# Patient Record
Sex: Male | Born: 1990 | Race: White | Hispanic: No | Marital: Single | Smoking: Former smoker
Health system: Southern US, Community
[De-identification: ages and names within clinical notes are randomized; demographics above are authoritative.]

## PROBLEM LIST (undated history)

## (undated) DIAGNOSIS — F329 Major depressive disorder, single episode, unspecified: Secondary | ICD-10-CM

## (undated) DIAGNOSIS — H9190 Unspecified hearing loss, unspecified ear: Secondary | ICD-10-CM

## (undated) DIAGNOSIS — F32A Depression, unspecified: Secondary | ICD-10-CM

## (undated) DIAGNOSIS — S8991XA Unspecified injury of right lower leg, initial encounter: Secondary | ICD-10-CM

## (undated) DIAGNOSIS — F419 Anxiety disorder, unspecified: Secondary | ICD-10-CM

## (undated) DIAGNOSIS — H8103 Meniere's disease, bilateral: Secondary | ICD-10-CM

## (undated) DIAGNOSIS — F410 Panic disorder [episodic paroxysmal anxiety] without agoraphobia: Secondary | ICD-10-CM

## (undated) HISTORY — DX: Depression, unspecified: F32.A

## (undated) HISTORY — DX: Major depressive disorder, single episode, unspecified: F32.9

## (undated) HISTORY — DX: Panic disorder (episodic paroxysmal anxiety): F41.0

## (undated) HISTORY — DX: Meniere's disease, bilateral: H81.03

## (undated) HISTORY — DX: Anxiety disorder, unspecified: F41.9

## (undated) HISTORY — DX: Unspecified hearing loss, unspecified ear: H91.90

## (undated) HISTORY — DX: Unspecified injury of right lower leg, initial encounter: S89.91XA

---
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
## 2012-01-16 HISTORY — PX: KNEE ARTHROSCOPY W/ DEBRIDEMENT: SHX1867

## 2015-08-28 ENCOUNTER — Telehealth: Payer: Self-pay

## 2015-08-28 NOTE — Telephone Encounter (Signed)
Left message for patient to return my call regarding pr-visit information. 

## 2015-08-29 ENCOUNTER — Ambulatory Visit (INDEPENDENT_AMBULATORY_CARE_PROVIDER_SITE_OTHER): Payer: BLUE CROSS/BLUE SHIELD | Admitting: Internal Medicine

## 2015-08-29 ENCOUNTER — Encounter: Payer: Self-pay | Admitting: Internal Medicine

## 2015-08-29 VITALS — BP 140/86 | HR 82 | Temp 98.3°F | Ht 73.0 in | Wt 177.0 lb

## 2015-08-29 DIAGNOSIS — F418 Other specified anxiety disorders: Secondary | ICD-10-CM

## 2015-08-29 DIAGNOSIS — F411 Generalized anxiety disorder: Secondary | ICD-10-CM | POA: Diagnosis not present

## 2015-08-29 DIAGNOSIS — F988 Other specified behavioral and emotional disorders with onset usually occurring in childhood and adolescence: Secondary | ICD-10-CM

## 2015-08-29 DIAGNOSIS — Z09 Encounter for follow-up examination after completed treatment for conditions other than malignant neoplasm: Secondary | ICD-10-CM

## 2015-08-29 DIAGNOSIS — F329 Major depressive disorder, single episode, unspecified: Secondary | ICD-10-CM

## 2015-08-29 DIAGNOSIS — F909 Attention-deficit hyperactivity disorder, unspecified type: Secondary | ICD-10-CM | POA: Diagnosis not present

## 2015-08-29 DIAGNOSIS — F419 Anxiety disorder, unspecified: Principal | ICD-10-CM

## 2015-08-29 MED ORDER — ESCITALOPRAM OXALATE 5 MG PO TABS: 5.0000 mg | ORAL_TABLET | Freq: Every day | ORAL | Status: DC

## 2015-08-29 NOTE — Patient Instructions (Signed)
Before you leave the office:  GO TO THE LAB  and get the blood work    GO TO THE FRONT DESK  Schedule a routine office visit or check up to be done in  4-5 weeks No  fasting     After you leave the office:  Get an appointment to see a psychiatrist  Watch for symptoms of mania    Bipolar Disorder Bipolar disorder is a mental illness. The term bipolar disorder actually is used to describe a group of disorders that all share varying degrees of emotional highs and lows that can interfere with daily functioning, such as work, school, or relationships. Bipolar disorder also can lead to drug abuse, hospitalization, and suicide. The emotional highs of bipolar disorder are periods of elation or irritability and high energy. These highs can range from a mild form (hypomania) to a severe form (mania). People experiencing episodes of hypomania may appear energetic, excitable, and highly productive. People experiencing mania may behave impulsively or erratically. They often make poor decisions. They may have difficulty sleeping. The most severe episodes of mania can involve having very distorted beliefs or perceptions about the world and seeing or hearing things that are not real (psychotic delusions and hallucinations).  The emotional lows of bipolar disorder (depression) also can range from mild to severe. Severe episodes of bipolar depression can involve psychotic delusions and hallucinations. Sometimes people with bipolar disorder experience a state of mixed mood. Symptoms of hypomania or mania and depression are both present during this mixed-mood episode. SIGNS AND SYMPTOMS There are signs and symptoms of the episodes of hypomania and mania as well as the episodes of depression. The signs and symptoms of hypomania and mania are similar but vary in severity. They include:  Inflated self-esteem or feeling of increased self-confidence.  Decreased need for sleep.  Unusual talkativeness (rapid or  pressured speech) or the feeling of a need to keep talking.  Sensation of racing thoughts or constant talking, with quick shifts between topics that may or may not be related (flight of ideas).  Decreased ability to focus or concentrate.  Increased purposeful activity, such as work, studies, or social activity, or nonproductive activity, such as pacing, squirming and fidgeting, or finger and toe tapping.  Impulsive behavior and use of poor judgment, resulting in high-risk activities, such as having unprotected sex or spending excessive amounts of money. Signs and symptoms of depression include the following:   Feelings of sadness, hopelessness, or helplessness.  Frequent or uncontrollable episodes of crying.  Lack of feeling anything or caring about anything.  Difficulty sleeping or sleeping too much.  Inability to enjoy the things you used to enjoy.   Desire to be alone all the time.   Feelings of guilt or worthlessness.  Lack of energy or motivation.   Difficulty concentrating, remembering, or making decisions.  Change in appetite or weight beyond normal fluctuations.  Thoughts of death or the desire to harm yourself. DIAGNOSIS  Bipolar disorder is diagnosed through an assessment by your caregiver. Your caregiver will ask questions about your emotional episodes. There are two main types of bipolar disorder. People with type I bipolar disorder have manic episodes with or without depressive episodes. People with type II bipolar disorder have hypomanic episodes and major depressive episodes, which are more serious than mild depression. The type of bipolar disorder you have can make an important difference in how your illness is monitored and treated. Your caregiver may ask questions about your medical history and  use of alcohol or drugs, including prescription medication. Certain medical conditions and substances also can cause emotional highs and lows that resemble bipolar  disorder (secondary bipolar disorder).  TREATMENT  Bipolar disorder is a long-term illness. It is best controlled with continuous treatment rather than treatment only when symptoms occur. The following treatments can be prescribed for bipolar disorders:  Medication--Medication can be prescribed by a doctor that is an expert in treating mental disorders (psychiatrists). Medications called mood stabilizers are usually prescribed to help control the illness. Other medications are sometimes added if symptoms of mania, depression, or psychotic delusions and hallucinations occur despite the use of a mood stabilizer.  Talk therapy--Some forms of talk therapy are helpful in providing support, education, and guidance. A combination of medication and talk therapy is best for managing the disorder over time. A procedure in which electricity is applied to your brain through your scalp (electroconvulsive therapy) is used in cases of severe mania when medication and talk therapy do not work or work too slowly.   This information is not intended to replace advice given to you by your health care provider. Make sure you discuss any questions you have with your health care provider.   Document Released: 12/01/2000 Document Revised: 09/15/2014 Document Reviewed: 09/20/2012 Elsevier Interactive Patient Education [employer]2016 Elsevier Inc.

## 2015-08-29 NOTE — Progress Notes (Signed)
Subjective:    Patient ID: Devin Chang, male    DOB: [birth year], 24 y.o.   MRN: [id number]  DOS:  08/29/2015 Type of visit - description : New patient, here with his mother. Interval history: His main concern is anxiety and depression. He moved he has anxiety and depression for many years. He realizes he also had ADD but H 21. 3 years ago in 2013 went to a psychologist, he was diagnosed with ADD but never treated because they recommend him to treat anxiety and depression first. Since then he is seeing a counselor, intolerance have not really changed much. In fact, he had to leave college for a few months earlier this year. Approximately on August 2016 he was feeling so badly physically when he got and shoes that he is started to smoke marijuana daily, that helped to some extent. He has multiple symptoms including anxiety, panic attacks, palpitations, poor sleep, sweats, lack of motivation, recommend thoughts. On further questioning, he does have episodes when he feels very depressed and others when he feels very good.     Review of Systems No fever chills, no weight loss No chest pain, shortness of breath associated with extreme anxiety. No syncope. At some point in the past he thought suicidal thoughts, not at the present time  Past Medical History  Diagnosis Date  . Right knee injury 2012    small chondral injury, right arthroscopy in 01/2012  . Depression   . Anxiety   . Panic attacks   . Hearing loss     left ear    Past Surgical History  Procedure Laterality Date  . Knee arthroscopy w/ debridement Right 01/16/2012    w/ Plica release, at [facility] in [place], [place]  . Wisdom tooth extraction  2012    Dr. Retta MacMohorn [place]    Social History   Social History  . Marital Status: Single    Spouse Name: N/A  . Number of Children: N/A  . Years of Education: 17   Occupational History  . student [employer].     Social History Main Topics  . Smoking status: Former  Games developermoker  . Smokeless tobacco: Not on file  . Alcohol Use: 0.0 oz/week    0 Standard drinks or equivalent per week  . Drug Use: Yes     Comment: Marijuana  . Sexual Activity: Not on file   Other Topics Concern  . Not on file   Social History Narrative        Medication List       This list is accurate as of: 08/29/15  3:49 PM.  Always use your most recent med list.               clindamycin 1 % gel  Commonly known as:  CLINDAGEL  Apply topically daily.     Melatonin 10 MG Caps  Take by mouth at bedtime as needed.           Objective:   Physical Exam BP 140/86 mmHg  Pulse 82  Temp(Src) 98.3 F (36.8 C) (Oral)  Ht 6\' 1"  (1.854 m)  Wt 177 lb (80.287 kg)  BMI 23.36 kg/m2  SpO2 99% General:   Well developed, well nourished . NAD.  HEENT:  Normocephalic . Face symmetric, atraumatic. Neck: No thyromegaly Lungs:  CTA B Normal respiratory effort, no intercostal retractions, no accessory muscle use. Heart: RRR,  no murmur.  No pretibial edema bilaterally  Skin: Not pale. Not jaundice Neurologic:  alert & oriented X3.  Speech normal, gait appropriate for age and unassisted Psych--  Cognition and judgment appear intact.  Cooperative with normal attention span and concentration.  Behavior appropriate. Moderately anxious, not depressed appearing. 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 PQQ9 scored  #24 which is severe depression. No suicidal. I do suspect bipolar on clinical grounds, and I recommend to see a psychiatrist however that may take a couple of months if not more. In the meantime I don't like to help him. We talk about possibly doing a low dose of SSRIs understanding the risk of induce mania sx  including risky behavior etc.  After the understood the risks, they agree to proceed, will prescribe Lexapro 5 mg daily, encourage close monitoring by family which they agreed to do. Recommend to see psychiatry as soon as possible Labs Continue counseling Stop marijuana RTC 4-5 weeks please he is seen by psychiatry.   Today, I spent more than 32   min with the patient: >50% of the time counseling regards anxiety, depression, available therapies, risks taking SSRIs.

## 2015-08-29 NOTE — Progress Notes (Signed)
Pre visit review using our clinic review tool, if applicable. No additional management support is needed unless otherwise documented below in the visit note. 

## 2015-08-30 LAB — BASIC METABOLIC PANEL
BUN: 16 mg/dL (ref 6–23)
CHLORIDE: 103 meq/L (ref 96–112)
CO2: 27 meq/L (ref 19–32)
Calcium: 10.3 mg/dL (ref 8.4–10.5)
Creatinine, Ser: 0.85 mg/dL (ref 0.40–1.50)
GFR: 118.35 mL/min (ref >60.00)
GLUCOSE: 90 mg/dL (ref 70–99)
POTASSIUM: 4.3 meq/L (ref 3.5–5.1)
SODIUM: 140 meq/L (ref 135–145)

## 2015-08-30 LAB — CBC WITH DIFFERENTIAL/PLATELET
BASOS PCT: 1.1 % (ref 0.0–3.0)
Basophils Absolute: 0.1 10*3/uL (ref 0.0–0.1)
EOS PCT: 0.8 % (ref 0.0–5.0)
Eosinophils Absolute: 0.1 10*3/uL (ref 0.0–0.7)
HCT: 46.8 % (ref 39.0–52.0)
Hemoglobin: 15.7 g/dL (ref 13.0–17.0)
LYMPHS ABS: 1.9 10*3/uL (ref 0.7–4.0)
Lymphocytes Relative: 21 % (ref 12.0–46.0)
MCHC: 33.5 g/dL (ref 30.0–36.0)
MCV: 90.2 fl (ref 78.0–100.0)
MONO ABS: 0.7 10*3/uL (ref 0.1–1.0)
Monocytes Relative: 7.8 % (ref 3.0–12.0)
NEUTROS ABS: 6.4 10*3/uL (ref 1.4–7.7)
NEUTROS PCT: 69.3 % (ref 43.0–77.0)
PLATELETS: 310 10*3/uL (ref 150.0–400.0)
RBC: 5.19 Mil/uL (ref 4.22–5.81)
RDW: 13.3 % (ref 11.5–15.5)
WBC: 9.2 10*3/uL (ref 4.0–10.5)

## 2015-08-30 LAB — TSH: TSH: 1.25 u[IU]/mL (ref 0.35–4.50)

## 2015-08-31 ENCOUNTER — Encounter: Payer: Self-pay | Admitting: Internal Medicine

## 2015-08-31 DIAGNOSIS — F329 Major depressive disorder, single episode, unspecified: Secondary | ICD-10-CM | POA: Insufficient documentation

## 2015-08-31 DIAGNOSIS — Z09 Encounter for follow-up examination after completed treatment for conditions other than malignant neoplasm: Secondary | ICD-10-CM | POA: Insufficient documentation

## 2015-08-31 DIAGNOSIS — F988 Other specified behavioral and emotional disorders with onset usually occurring in childhood and adolescence: Secondary | ICD-10-CM | POA: Insufficient documentation

## 2015-08-31 DIAGNOSIS — F419 Anxiety disorder, unspecified: Principal | ICD-10-CM | POA: Insufficient documentation

## 2015-08-31 NOTE — Assessment & Plan Note (Signed)
Anxiety, depression, ADD: Long history of symptoms, they have been untreated except for counseling for the last 3 years. PQQ9 scored  #24 which is severe depression. No suicidal. I do suspect bipolar on clinical grounds, and I recommend to see a psychiatrist however that may take a couple of months if not more. In the meantime I don't like to help him. We talk about possibly doing a low dose of SSRIs understanding the risk of induce mania sx including risky behavior etc.  After the understood the risks, they agree to proceed, will prescribe Lexapro 5 mg daily, encourage close monitoring by family which they agreed to do. Recommend to see psychiatry as soon as possible Labs Continue counseling Stop marijuana RTC 4-5 weeks please he is seen by psychiatry.

## 2015-09-24 ENCOUNTER — Ambulatory Visit: Payer: BLUE CROSS/BLUE SHIELD | Admitting: Internal Medicine

## 2015-12-11 DIAGNOSIS — F411 Generalized anxiety disorder: Secondary | ICD-10-CM | POA: Diagnosis not present

## 2016-01-01 DIAGNOSIS — F411 Generalized anxiety disorder: Secondary | ICD-10-CM | POA: Diagnosis not present

## 2016-01-08 DIAGNOSIS — F411 Generalized anxiety disorder: Secondary | ICD-10-CM | POA: Diagnosis not present

## 2016-01-22 DIAGNOSIS — F411 Generalized anxiety disorder: Secondary | ICD-10-CM | POA: Diagnosis not present

## 2016-02-26 DIAGNOSIS — F411 Generalized anxiety disorder: Secondary | ICD-10-CM | POA: Diagnosis not present

## 2016-03-04 DIAGNOSIS — F411 Generalized anxiety disorder: Secondary | ICD-10-CM | POA: Diagnosis not present

## 2016-04-11 DIAGNOSIS — F411 Generalized anxiety disorder: Secondary | ICD-10-CM | POA: Diagnosis not present

## 2016-05-16 DIAGNOSIS — F411 Generalized anxiety disorder: Secondary | ICD-10-CM | POA: Diagnosis not present

## 2016-06-20 DIAGNOSIS — F411 Generalized anxiety disorder: Secondary | ICD-10-CM | POA: Diagnosis not present

## 2016-06-24 DIAGNOSIS — F411 Generalized anxiety disorder: Secondary | ICD-10-CM | POA: Diagnosis not present

## 2016-07-08 DIAGNOSIS — F411 Generalized anxiety disorder: Secondary | ICD-10-CM | POA: Diagnosis not present

## 2016-07-25 DIAGNOSIS — F411 Generalized anxiety disorder: Secondary | ICD-10-CM | POA: Diagnosis not present

## 2016-08-15 DIAGNOSIS — F411 Generalized anxiety disorder: Secondary | ICD-10-CM | POA: Diagnosis not present

## 2016-09-08 DIAGNOSIS — H9190 Unspecified hearing loss, unspecified ear: Secondary | ICD-10-CM

## 2016-09-08 HISTORY — DX: Unspecified hearing loss, unspecified ear: H91.90

## 2016-09-08 HISTORY — PX: TONSILLECTOMY: SUR1361

## 2016-09-12 DIAGNOSIS — F411 Generalized anxiety disorder: Secondary | ICD-10-CM | POA: Diagnosis not present

## 2016-09-18 ENCOUNTER — Ambulatory Visit (INDEPENDENT_AMBULATORY_CARE_PROVIDER_SITE_OTHER): Payer: BLUE CROSS/BLUE SHIELD | Admitting: Physician Assistant

## 2016-09-18 VITALS — BP 122/84 | HR 64 | Temp 98.5°F | Resp 16 | Ht 72.0 in | Wt 169.8 lb

## 2016-09-18 DIAGNOSIS — J029 Acute pharyngitis, unspecified: Secondary | ICD-10-CM | POA: Diagnosis not present

## 2016-09-18 LAB — POCT RAPID STREP A (OFFICE): Rapid Strep A Screen: NEGATIVE

## 2016-09-18 NOTE — Patient Instructions (Addendum)
Your rapid strep test was negative. I will contact you in a few days if your culture is positive. If it is positive, I will call in a prescription for antibiotics to your pharmacy. At this time the treatment is supportive care. Please see below for home care tips. You can also gargle with liquid Benadryl if your pain increases. Stay well hydrated. Tylenol for pain. You may develop a fever over the next few days. This is typical with viruses.  Come back in 5-7 days if you are not better/symptoms worsen.   Pharyngitis Pharyngitis is redness, pain, and swelling (inflammation) of your pharynx. What are the causes? Pharyngitis is usually caused by infection. Most of the time, these infections are from viruses (viral) and are part of a cold. However, sometimes pharyngitis is caused by bacteria (bacterial). Pharyngitis can also be caused by allergies. Viral pharyngitis may be spread from person to person by coughing, sneezing, and personal items or utensils (cups, forks, spoons, toothbrushes). Bacterial pharyngitis may be spread from person to person by more intimate contact, such as kissing. What are the signs or symptoms? Symptoms of pharyngitis include:  Sore throat.  Tiredness (fatigue).  Low-grade fever.  Headache.  Joint pain and muscle aches.  Skin rashes.  Swollen lymph nodes.  Plaque-like film on throat or tonsils (often seen with bacterial pharyngitis). How is this diagnosed? Your health care provider will ask you questions about your illness and your symptoms. Your medical history, along with a physical exam, is often all that is needed to diagnose pharyngitis. Sometimes, a rapid strep test is done. Other lab tests may also be done, depending on the suspected cause. How is this treated? Viral pharyngitis will usually get better in 3-4 days without the use of medicine. Bacterial pharyngitis is treated with medicines that kill germs (antibiotics). Follow these instructions at  home:  Drink enough water and fluids to keep your urine clear or pale yellow.  Only take over-the-counter or prescription medicines as directed by your health care provider:  If you are prescribed antibiotics, make sure you finish them even if you start to feel better.  Do not take aspirin.  Get lots of rest.  Gargle with 8 oz of salt water ( tsp of salt per 1 qt of water) as often as every 1-2 hours to soothe your throat.  Throat lozenges (if you are not at risk for choking) or sprays may be used to soothe your throat. Contact a health care provider if:  You have large, tender lumps in your neck.  You have a rash.  You cough up green, yellow-brown, or bloody spit. Get help right away if:  Your neck becomes stiff.  You drool or are unable to swallow liquids.  You vomit or are unable to keep medicines or liquids down.  You have severe pain that does not go away with the use of recommended medicines.  You have trouble breathing (not caused by a stuffy nose). This information is not intended to replace advice given to you by your health care provider. Make sure you discuss any questions you have with your health care provider. Document Released: 08/25/2005 Document Revised: 01/31/2016 Document Reviewed: 05/02/2013 Elsevier Interactive Patient Education  2017 [employer]    IF you received an x-ray today, you will receive an invoice from [facility]. Please contact [facility] at [phone] with questions or concerns regarding your invoice.   IF you received labwork today, you will receive an invoice from [facility]. Please contact  [facility] at [phone] with questions or concerns regarding your invoice.   Our billing staff will not be able to assist you with questions regarding bills from these companies.  You will be contacted with the lab results as soon as they are available. The fastest way to get your results is to activate your My Chart account.  Instructions are located on the last page of this paperwork. If you have not heard from [place] regarding the results in 2 weeks, please contact this office.

## 2016-09-18 NOTE — Progress Notes (Signed)
   Al CorpusStanhope M [place]any  MRN: [id number] DOB: 02-05-1991  PCP: Willow OraJose Paz, MD  Subjective:  Pt is a 26 year old male who presents to clinic for sore throat x two days. He felt a tickle in his throat yesterday. This morning he woke up with 4/10 throat pain. +difficulty and pain with swallowing. +body aches and chills yesterday  Took Tylenol yesterday, didn't help much.  Denies SOB, difficulty breathing, fever, headache, chest pain, cough.  Possible sick contact: His boss has had strep and flu in the last 4 weeks.   Review of Systems  Constitutional: Negative for chills and diaphoresis.  HENT: Positive for sore throat.   Respiratory: Negative for cough, chest tightness, shortness of breath and wheezing.   Cardiovascular: Negative for chest pain, palpitations and leg swelling.  Gastrointestinal: Negative for diarrhea, nausea and vomiting.  Neurological: Negative for dizziness, syncope, light-headedness and headaches.  Psychiatric/Behavioral: Negative for sleep disturbance.    Patient Active Problem List   Diagnosis Date Noted  . Anxiety and depression 08/31/2015  . ADD (attention deficit disorder) 08/31/2015  . PCP NOTES >>>>>>> 08/31/2015    Current Outpatient Prescriptions on File Prior to Visit  Medication Sig Dispense Refill  . escitalopram (LEXAPRO) 5 MG tablet Take 1 tablet (5 mg total) by mouth daily. 30 tablet 1  . clindamycin (CLINDAGEL) 1 % gel Apply topically daily.    . Melatonin 10 MG CAPS Take by mouth at bedtime as needed.     No current facility-administered medications on file prior to visit.     No Known Allergies   Objective:  BP 122/84   Pulse 64   Temp 98.5 F (36.9 C) (Oral)   Resp 16   Ht 6' (1.829 m)   Wt 169 lb 12.8 oz (77 kg)   SpO2 96%   BMI 23.03 kg/m   Physical Exam  Constitutional: He is oriented to person, place, and time and well-developed, well-nourished, and in no distress. No distress.  HENT:  Right Ear: Tympanic membrane normal.    Left Ear: Tympanic membrane normal.  Mouth/Throat: Mucous membranes are normal. Oropharyngeal exudate, posterior oropharyngeal edema and posterior oropharyngeal erythema present.  Cardiovascular: Normal rate, regular rhythm and normal heart sounds.   Neurological: He is alert and oriented to person, place, and time. GCS score is 15.  Skin: Skin is warm and dry.  Psychiatric: Mood, memory, affect and judgment normal.  Vitals reviewed.  Results for orders placed or performed in visit on 09/18/16  POCT rapid strep A  Result Value Ref Range   Rapid Strep A Screen Negative Negative    Assessment and Plan :  1. Acute pharyngitis, unspecified etiology 2. Sore throat - POCT rapid strep A - Culture, Group A Strep - Culture pending. Will contact w results. Supportive care encouraged: Salt water gargles, push fluids. RTC in 5-7 days if no improvement.   Marco CollieWhitney Almas Rake, PA-C  [facility] 09/18/2016 9:06 AM

## 2016-09-21 LAB — CULTURE, GROUP A STREP: Strep A Culture: NEGATIVE

## 2016-09-22 ENCOUNTER — Encounter: Payer: Self-pay | Admitting: Physician Assistant

## 2016-09-22 ENCOUNTER — Ambulatory Visit (INDEPENDENT_AMBULATORY_CARE_PROVIDER_SITE_OTHER): Payer: BLUE CROSS/BLUE SHIELD | Admitting: Physician Assistant

## 2016-09-22 VITALS — BP 112/70 | HR 120 | Temp 98.1°F | Resp 16 | Ht 72.0 in | Wt 167.6 lb

## 2016-09-22 DIAGNOSIS — R070 Pain in throat: Secondary | ICD-10-CM

## 2016-09-22 DIAGNOSIS — R000 Tachycardia, unspecified: Secondary | ICD-10-CM | POA: Diagnosis not present

## 2016-09-22 LAB — POCT CBC
GRANULOCYTE PERCENT: 82.1 % — AB (ref 37–80)
HEMATOCRIT: 44.9 % (ref 43.5–53.7)
Hemoglobin: 15.5 g/dL (ref 14.1–18.1)
LYMPH, POC: 1.5 (ref 0.6–3.4)
MCH, POC: 31.2 pg (ref 27–31.2)
MCHC: 34.6 g/dL (ref 31.8–35.4)
MCV: 90.1 fL (ref 80–97)
MID (CBC): 0.8 (ref 0–0.9)
MPV: 8.7 fL (ref 0–99.8)
POC GRANULOCYTE: 10.7 — AB (ref 2–6.9)
POC LYMPH %: 11.8 % (ref 10–50)
POC MID %: 6.1 %M (ref 0–12)
Platelet Count, POC: 358 10*3/uL (ref 142–424)
RBC: 4.98 M/uL (ref 4.69–6.13)
RDW, POC: 13 %
WBC: 13 10*3/uL — AB (ref 4.6–10.2)

## 2016-09-22 LAB — POCT RAPID STREP A (OFFICE): Rapid Strep A Screen: NEGATIVE

## 2016-09-22 MED ORDER — CEFTRIAXONE SODIUM 1 G IJ SOLR
1.0000 g | Freq: Once | INTRAMUSCULAR | Status: AC
  Administered 2016-09-22: 1 g via INTRAMUSCULAR

## 2016-09-22 MED ORDER — LIDOCAINE VISCOUS 2 % MT SOLN: 20.0000 mL | OROMUCOSAL | 0 refills | Status: DC | PRN

## 2016-09-22 MED ORDER — AMOXICILLIN-POT CLAVULANATE 875-125 MG PO TABS: 1.0000 | ORAL_TABLET | Freq: Two times a day (BID) | ORAL | 0 refills | Status: DC

## 2016-09-22 NOTE — Patient Instructions (Signed)
     IF you received an x-ray today, you will receive an invoice from [facility]. Please contact [facility] at [phone] with questions or concerns regarding your invoice.   IF you received labwork today, you will receive an invoice from [facility]. Please contact [facility] at [phone] with questions or concerns regarding your invoice.   Our billing staff will not be able to assist you with questions regarding bills from these companies.  You will be contacted with the lab results as soon as they are available. The fastest way to get your results is to activate your My Chart account. Instructions are located on the last page of this paperwork. If you have not heard from us regarding the results in 2 weeks, please contact this office.     

## 2016-09-22 NOTE — Progress Notes (Signed)
09/22/2016 11:32 AM   DOB: [birth year] / MRN: [id number]  SUBJECTIVE:  Devin Chang is a 26 y.o. male presenting for left throbbing ear pain that started at 2 am this morning.  Tried tylenol which help. The ear pain has been present for the last 2 days.  Was seen 4 days ago for suspected viral illness. He associates severe throat pain.   He has No Known Allergies.   He  has a past medical history of Anxiety; Depression; Hearing loss; Panic attacks; and Right knee injury (2012).    He  reports that he has quit smoking. He has quit using smokeless tobacco. He reports that he drinks alcohol. He reports that he uses drugs. He  has no sexual activity history on file. The patient  has a past surgical history that includes Knee arthroscopy w/ debridement (Right, 01/16/2012) and Wisdom tooth extraction (2012).  His family history includes Arthritis in his sister; Cancer in his maternal uncle and paternal grandfather; Dementia in his paternal grandmother; Depression in his sister; Hyperlipidemia in his maternal grandmother; Hypertension in his maternal grandmother.  Review of Systems  Constitutional: Positive for malaise/fatigue. Negative for chills, diaphoresis and fever.  HENT: Positive for ear pain (left) and sore throat. Negative for congestion, ear discharge, nosebleeds and sinus pain.   Respiratory: Negative for cough.   Cardiovascular: Negative for chest pain.  Gastrointestinal: Negative for nausea and vomiting.  Neurological: Negative for dizziness.    The problem list and medications were reviewed and updated by myself where necessary and exist elsewhere in the encounter.   OBJECTIVE:  BP 112/70   Pulse (!) 120   Temp 98.1 F (36.7 C) (Oral)   Resp 16   Ht 6' (1.829 m)   Wt 167 lb 9.6 oz (76 kg)   SpO2 99%   BMI 22.73 kg/m   Orthostatic VS for the past 24 hrs:  BP- Lying Pulse- Lying BP- Sitting Pulse- Sitting BP- Standing at 0 minutes Pulse- Standing at 0 minutes  09/22/16  1047 130/70 100 130/80 88 118/80 104       Physical Exam  Constitutional: He is oriented to person, place, and time. He appears well-developed and well-nourished.  HENT:  Right Ear: Tympanic membrane and external ear normal.  Left Ear: Tympanic membrane and external ear normal.  Nose: Nose normal.  Mouth/Throat: Mucous membranes are normal. Oropharyngeal exudate present.    Cardiovascular: Regular rhythm and intact distal pulses.   No extrasystoles are present. Tachycardia present.  Exam reveals no gallop and no friction rub.   No murmur heard. Pulmonary/Chest: Effort normal and breath sounds normal.  Musculoskeletal: Normal range of motion.  Neurological: He is alert and oriented to person, place, and time. No cranial nerve deficit.  Skin: Skin is warm and dry. He is not diaphoretic.    Results for orders placed or performed in visit on 09/22/16 (from the past 72 hour(s))  POCT CBC     Status: Abnormal   Collection Time: 09/22/16 10:47 AM  Result Value Ref Range   WBC 13.0 (A) 4.6 - 10.2 K/uL   Lymph, poc 1.5 0.6 - 3.4   POC LYMPH PERCENT 11.8 10 - 50 %L   MID (cbc) 0.8 0 - 0.9   POC MID % 6.1 0 - 12 %M   POC Granulocyte 10.7 (A) 2 - 6.9   Granulocyte percent 82.1 (A) 37 - 80 %G   RBC 4.98 4.69 - 6.13 M/uL   Hemoglobin 15.5 14.1 - 18.1  g/dL   HCT, POC 16.1 09.6 - 53.7 %   MCV 90.1 80 - 97 fL   MCH, POC 31.2 27 - 31.2 pg   MCHC 34.6 31.8 - 35.4 g/dL   RDW, POC 04.5 %   Platelet Count, POC 358 142 - 424 K/uL   MPV 8.7 0 - 99.8 fL  POCT rapid strep A     Status: None   Collection Time: 09/22/16 10:48 AM  Result Value Ref Range   Rapid Strep A Screen Negative Negative    No results found.  ASSESSMENT AND PLAN:  Devin Chang was seen today for sore throat and ear pain.  Diagnoses and all orders for this visit:  Throat pain in adult Comments: Granyulocytosis and severe left sided throat pain along with swelling.  He has improved with viscous lidocaine here. Afbrile.  Low threshold to RTC tomorrow.  Orders: -     POCT CBC -     POCT rapid strep A -     Culture, Group A Strep -     Orthostatic vital signs -     cefTRIAXone (ROCEPHIN) injection 1 g; Inject 1 g into the muscle once. -     amoxicillin-clavulanate (AUGMENTIN) 875-125 MG tablet; Take 1 tablet by mouth 2 (two) times daily. -     lidocaine (XYLOCAINE) 2 % solution; Use as directed 20 mLs in the mouth or throat every 4 (four) hours as needed for mouth pain.  Sinus tachycardia Comments: Improved on orthostatics.  Possibly 2/2 pain control.     The patient is advised to call or return to clinic if he does not see an improvement in symptoms, or to seek the care of the closest emergency department if he worsens with the above plan.   Deliah Boston, MHS, PA-C [facility] 09/22/2016 11:32 AM

## 2016-09-26 LAB — CULTURE, GROUP A STREP: Strep A Culture: NEGATIVE

## 2016-10-06 DIAGNOSIS — H8103 Meniere's disease, bilateral: Secondary | ICD-10-CM | POA: Diagnosis not present

## 2016-10-06 DIAGNOSIS — H903 Sensorineural hearing loss, bilateral: Secondary | ICD-10-CM | POA: Diagnosis not present

## 2016-10-10 ENCOUNTER — Ambulatory Visit (INDEPENDENT_AMBULATORY_CARE_PROVIDER_SITE_OTHER): Payer: BLUE CROSS/BLUE SHIELD | Admitting: Family Medicine

## 2016-10-10 VITALS — BP 110/76 | HR 90 | Temp 98.2°F | Resp 18 | Ht 72.0 in | Wt 165.0 lb

## 2016-10-10 DIAGNOSIS — Z23 Encounter for immunization: Secondary | ICD-10-CM | POA: Diagnosis not present

## 2016-10-10 DIAGNOSIS — J029 Acute pharyngitis, unspecified: Secondary | ICD-10-CM

## 2016-10-10 DIAGNOSIS — J0120 Acute ethmoidal sinusitis, unspecified: Secondary | ICD-10-CM

## 2016-10-10 LAB — POCT RAPID STREP A (OFFICE): Rapid Strep A Screen: NEGATIVE

## 2016-10-10 MED ORDER — CLARITHROMYCIN ER 500 MG PO TB24: 1000.0000 mg | ORAL_TABLET | Freq: Every day | ORAL | 0 refills | Status: DC

## 2016-10-10 MED ORDER — FLUTICASONE PROPIONATE 50 MCG/ACT NA SUSP: 2.0000 | Freq: Every day | NASAL | 1 refills | Status: DC

## 2016-10-10 NOTE — Patient Instructions (Addendum)
Drink plenty of fluids to stay well hydrated  Use the fluticasone nose spray 2 sprays each nostril twice daily for about 4 days, then once daily  Take the Biaxin (clarithromycin) 2 pills once daily with meal  Return if getting worse or not improving    IF you received an x-ray today, you will receive an invoice from [facility]. Please contact [facility] at [phone] with questions or concerns regarding your invoice.   IF you received labwork today, you will receive an invoice from [facility]. Please contact [facility] at [phone] with questions or concerns regarding your invoice.   Our billing staff will not be able to assist you with questions regarding bills from these companies.  You will be contacted with the lab results as soon as they are available. The fastest way to get your results is to activate your My Chart account. Instructions are located on the last page of this paperwork. If you have not heard from us regarding the results in 2 weeks, please contact this office.

## 2016-10-10 NOTE — Progress Notes (Signed)
Patient ID: Devin Chang, male    DOB: [birth year]  Age: 26 y.o. MRN: [id number]  Chief Complaint  Patient presents with  . Follow-up    SORE THROAT    Subjective:   26 year old man who was in here several weeks ago with a sore throat was treated with Augmentin. He took an incomplete course, felt better, got worse, took the rest of it. He continues to have intermittent sore throat. He has not had any other flulike symptoms such as fever or aches chills etc. He does have a little postnasal drainage which clears out of his throat. He has not been febrile and notice nodes.  Current allergies, medications, problem list, past/family and social histories reviewed.  Objective:  BP 110/76 (BP Location: Right Arm, Patient Position: Sitting, Cuff Size: Small)   Pulse 90   Temp 98.2 F (36.8 C) (Oral)   Resp 18   Ht 6' (1.829 m)   Wt 165 lb (74.8 kg)   SpO2 98%   BMI 22.38 kg/m   No acute distress. No lymphadenopathy. Throat has moderately enlarged tonsils. There is some exudate in the crypts on the right tonsil just on the uvula. His chest is clear to auscultation. Heart regular without murmur. Strep screen and culture taken.  Talk about the need for flu shot and he agrees to going ahead and getting it today.  Results for orders placed or performed in visit on 10/10/16  POCT rapid strep A  Result Value Ref Range   Rapid Strep A Screen Negative Negative     Assessment & Plan:   Assessment: 1. Sore throat   2. Needs flu shot   3. Acute non-recurrent ethmoidal sinusitis       Plan: Treat after seeing the results of the labs. I suspect this is a low-grade sinusitis with postnasal drainage causing the persistent sore throat.  Will treat with clarithromycin for sinusitis and pharyngitis. If not improving he is to return. The fluticasone is to try and open up the sinus orifices. If he is not improving a round of oral steroids might be in order.  Orders Placed This Encounter   Procedures  . Culture, Group A Strep    Order Specific Question:   Source    Answer:   throat  . Flu Vaccine QUAD 36+ mos IM  . POCT rapid strep A    Meds ordered this encounter  Medications  . clarithromycin (BIAXIN XL) 500 MG 24 hr tablet    Sig: Take 2 tablets (1,000 mg total) by mouth daily.    Dispense:  20 tablet    Refill:  0  . fluticasone (FLONASE) 50 MCG/ACT nasal spray    Sig: Place 2 sprays into both nostrils daily.    Dispense:  16 g    Refill:  1         Patient Instructions   Drink plenty of fluids to stay well hydrated  Use the fluticasone nose spray 2 sprays each nostril twice daily for about 4 days, then once daily  Take the Biaxin (clarithromycin) 2 pills once daily with meal  Return if getting worse or not improving    IF you received an x-ray today, you will receive an invoice from [facility]. Please contact [facility] at [phone] with questions or concerns regarding your invoice.   IF you received labwork today, you will receive an invoice from [facility]. Please contact [facility] at [phone] with questions or concerns regarding your invoice.   Our  billing staff will not be able to assist you with questions regarding bills from these companies.  You will be contacted with the lab results as soon as they are available. The fastest way to get your results is to activate your My Chart account. Instructions are located on the last page of this paperwork. If you have not heard from [place] regarding the results in 2 weeks, please contact this office.        No Follow-up on file.   Faithann Natal, MD 10/10/2016

## 2016-10-13 LAB — CULTURE, GROUP A STREP: STREP A CULTURE: NEGATIVE

## 2016-10-16 ENCOUNTER — Encounter (HOSPITAL_COMMUNITY): Payer: Self-pay | Admitting: Emergency Medicine

## 2016-10-16 ENCOUNTER — Emergency Department (HOSPITAL_COMMUNITY)
Admission: EM | Admit: 2016-10-16 | Discharge: 2016-10-16 | Disposition: A | Discharge status: Home / Self Care | Payer: BLUE CROSS/BLUE SHIELD | Attending: Dermatology | Admitting: Dermatology

## 2016-10-16 ENCOUNTER — Ambulatory Visit (INDEPENDENT_AMBULATORY_CARE_PROVIDER_SITE_OTHER): Payer: BLUE CROSS/BLUE SHIELD | Admitting: Urgent Care

## 2016-10-16 VITALS — BP 120/78 | HR 89 | Temp 98.6°F | Resp 17 | Ht 72.0 in | Wt 162.0 lb

## 2016-10-16 DIAGNOSIS — R220 Localized swelling, mass and lump, head: Secondary | ICD-10-CM | POA: Diagnosis not present

## 2016-10-16 DIAGNOSIS — Z5321 Procedure and treatment not carried out due to patient leaving prior to being seen by health care provider: Secondary | ICD-10-CM | POA: Insufficient documentation

## 2016-10-16 DIAGNOSIS — J36 Peritonsillar abscess: Secondary | ICD-10-CM

## 2016-10-16 DIAGNOSIS — J029 Acute pharyngitis, unspecified: Secondary | ICD-10-CM

## 2016-10-16 DIAGNOSIS — Z87891 Personal history of nicotine dependence: Secondary | ICD-10-CM | POA: Diagnosis not present

## 2016-10-16 DIAGNOSIS — R221 Localized swelling, mass and lump, neck: Secondary | ICD-10-CM | POA: Diagnosis not present

## 2016-10-16 DIAGNOSIS — Z79899 Other long term (current) drug therapy: Secondary | ICD-10-CM | POA: Insufficient documentation

## 2016-10-16 LAB — POCT CBC
GRANULOCYTE PERCENT: 77.3 % (ref 37–80)
HEMATOCRIT: 42.4 % — AB (ref 43.5–53.7)
HEMOGLOBIN: 14.5 g/dL (ref 14.1–18.1)
LYMPH, POC: 2.5 (ref 0.6–3.4)
MCH: 30.4 pg (ref 27–31.2)
MCHC: 34.1 g/dL (ref 31.8–35.4)
MCV: 89 fL (ref 80–97)
MID (cbc): 0.3 (ref 0–0.9)
MPV: 8.7 fL (ref 0–99.8)
POC GRANULOCYTE: 9.7 — AB (ref 2–6.9)
POC LYMPH PERCENT: 19.9 %L (ref 10–50)
POC MID %: 2.8 %M (ref 0–12)
Platelet Count, POC: 342 10*3/uL (ref 142–424)
RBC: 4.76 M/uL (ref 4.69–6.13)
RDW, POC: 13.8 %
WBC: 12.5 10*3/uL — AB (ref 4.6–10.2)

## 2016-10-16 NOTE — Patient Instructions (Addendum)
Address: [address], [place], [place] [address] Phone: [phone]    Peritonsillar Cellulitis Introduction Peritonsillar cellulitis is an infection around a tonsil that results in a severe sore throat. If the condition is not treated, pus can collect in the throat. What are the causes? Peritonsillar cellulitis is usually caused by a combination of several strains of bacteria. What increases the risk? This condition is more likely to develop in:  People who have frequent tonsil infections.  People who take antibiotic medicines frequently.  People who smoke. What are the signs or symptoms? Early symptoms of this condition include:  A fever.  Chills.  Soreness on one side of the throat.  Pain in one ear.  Pain when swallowing.  Tiredness. Later symptoms include:  Severe pain when swallowing.  Drooling.  Trouble opening the mouth wide.  Bad breath.  Changes in the voice. How is this diagnosed? This condition may be diagnosed based on symptoms, a physical exam, and a test in which a sample of fluid from the throat is tested (throat culture). You may also have a blood test. How is this treated? This condition is usually treated with antibiotic medicines. You may need to take these medicines by mouth or through an IV tube. Additional treatment may include:  Medicines for pain, fever, or swelling. Some medicines may be given through an IV tube.  A procedure to drain a collection of pus (abscess).  Surgery to remove the tonsils (tonsillectomy). This may be done if you get this condition often. Follow these instructions at home: Eating and drinking  If it is hard to swallow, try switching to a liquid or soft-food diet until you get better.  Drink enough fluid to keep your urine clear or pale yellow. Medicines  Take your antibiotic medicine as told by your health care provider. Do not stop taking the antibiotic even if you start to feel better.  Take  over-the-counter and prescription medicines only as told by your health care provider. Activity  Rest and get plenty of sleep.  Return to work or school as directed by your health care provider. General instructions  Do not smoke.  Keep all follow-up visits as told by your health care provider. This is important.  To help ease pain and swelling, gargle with a salt-water mixture 3-4 times per day or as needed. To make a salt-water mixture, completely dissolve -1 tsp of salt in 1 cup of warm water. Contact a health care provider if:  Your swelling gets worse.  You have difficulty swallowing.  You are unable to take your antibiotic.  You have a fever that does not improve after you take medicine.  Your voice changes. Get help right away if:  You have trouble breathing.  Your pain gets worse.  You see pus around or near your tonsils.  You cough up bloody spit.  You are unable to swallow.  You are drooling. This information is not intended to replace advice given to you by your health care provider. Make sure you discuss any questions you have with your health care provider. Document Released: 11/19/2009 Document Revised: 01/31/2016 Document Reviewed: 08/21/2014  2017 Elsevier

## 2016-10-16 NOTE — ED Notes (Signed)
Pt told registration that they are leaving.

## 2016-10-16 NOTE — Progress Notes (Signed)
MRN: [id number] DOB: 01/06/1991  Subjective:   Devin Chang is a 26 y.o. male presenting for chief complaint of Sore Throat (Off and on 1 month) and Sinusitis (Yellow sputum)  Reports 1 month history of worsening tonsils, has difficulty opening jaw and swallowing. Has been spitting phlegm/mucus. Patient was started on amoxicillin which helped temporarily. He took it but was not fully compliant with the course of antibiotic. He saw his ENT 10/06/2016 for his hearing and was advised to finish antibiotic course for his throat and is now being referred to Dr. Marjie Skiff for his throat pain. He did end up getting another prescription for clarithromycin without any relief and in fact reports worsening symptoms since 10/10/2016, his last office visit. Denies fever, cough, neck pain, sinus pain, sinus congestion, cough. Denies smoking cigarettes. Used to smoke socially, no longer smokes.  Devin Chang has a current medication list which includes the following prescription(s): amphetamine-dextroamphetamine, clarithromycin, dm-doxylamine-acetaminophen, escitalopram, fluticasone, lidocaine, and triamterene-hydrochlorothiazide. Also has No Known Allergies.  Devin Chang  has a past medical history of Anxiety; Depression; Hearing loss; Panic attacks; and Right knee injury (2012). Also  has a past surgical history that includes Knee arthroscopy w/ debridement (Right, 01/16/2012) and Wisdom tooth extraction (2012).  His family history includes Arthritis in his sister; Cancer in his maternal uncle and paternal grandfather; Dementia in his paternal grandmother; Depression in his sister; Hyperlipidemia in his maternal grandmother; Hypertension in his maternal grandmother.   Objective:   Vitals: BP 120/78 (BP Location: Right Arm, Patient Position: Sitting, Cuff Size: Normal)   Pulse 89   Temp 98.6 F (37 C) (Oral)   Resp 17   Ht 6' (1.829 m)   Wt 162 lb (73.5 kg)   SpO2 100%   BMI 21.97 kg/m   Physical Exam    Constitutional: He appears well-developed and well-nourished.  HENT:  TM's intact bilaterally, no effusions or erythema. Nasal turbinates pink and moist, nasal passages patent. No sinus tenderness. Oropharynx with peritonsillar erythema and swelling L>R, mucous membranes moist, dentition in good repair.  Eyes: Right eye exhibits no discharge. Left eye exhibits no discharge.  Neck: Normal range of motion. Neck supple.  Lymphadenopathy:    He has no cervical adenopathy.  Skin: Skin is warm and dry.   Results for orders placed or performed in visit on 10/16/16 (from the past 24 hour(s))  POCT CBC     Status: Abnormal   Collection Time: 10/16/16  5:37 PM  Result Value Ref Range   WBC 12.5 (A) 4.6 - 10.2 K/uL   Lymph, poc 2.5 0.6 - 3.4   POC LYMPH PERCENT 19.9 10 - 50 %L   MID (cbc) 0.3 0 - 0.9   POC MID % 2.8 0 - 12 %M   POC Granulocyte 9.7 (A) 2 - 6.9   Granulocyte percent 77.3 37 - 80 %G   RBC 4.76 4.69 - 6.13 M/uL   Hemoglobin 14.5 14.1 - 18.1 g/dL   HCT, POC 40.9 (A) 81.1 - 53.7 %   MCV 89.0 80 - 97 fL   MCH, POC 30.4 27 - 31.2 pg   MCHC 34.1 31.8 - 35.4 g/dL   RDW, POC 91.4 %   Platelet Count, POC 342 142 - 424 K/uL   MPV 8.7 0 - 99.8 fL   Assessment and Plan :   1. Peritonsillar cellulitis 2. Sore throat - Will refer patient to [facility] ED for emergent treatment including IV antibiotics with clindamycin and consideration for imaging/needle aspiration/drainage. Case  reported to [employer] Nurse Matt.  Wallis BambergMario Elsworth Ledin, PA-C [facility] at [facility] [phone] 10/16/2016  5:15 PM

## 2016-10-16 NOTE — ED Triage Notes (Signed)
[facility] urgent care called, states pt is being sent over for peritonsillar abscess.

## 2016-10-16 NOTE — ED Notes (Signed)
X1 CALLBACK NO ANSWER IN LOBBY

## 2016-10-16 NOTE — ED Triage Notes (Signed)
Per pt, states throat, mouth swollen for over a month-has been seen by UC and ENT-was seen by [facility] today-sent to ED for evaluation-peritonsilar abscess

## 2016-10-17 ENCOUNTER — Emergency Department (HOSPITAL_COMMUNITY)
Admission: EM | Admit: 2016-10-17 | Discharge: 2016-10-17 | Disposition: A | Discharge status: Home / Self Care | Payer: BLUE CROSS/BLUE SHIELD | Attending: Emergency Medicine | Admitting: Emergency Medicine

## 2016-10-17 ENCOUNTER — Emergency Department (HOSPITAL_COMMUNITY): Payer: BLUE CROSS/BLUE SHIELD

## 2016-10-17 ENCOUNTER — Encounter (HOSPITAL_COMMUNITY): Payer: Self-pay | Admitting: Emergency Medicine

## 2016-10-17 DIAGNOSIS — J029 Acute pharyngitis, unspecified: Secondary | ICD-10-CM | POA: Diagnosis not present

## 2016-10-17 DIAGNOSIS — J039 Acute tonsillitis, unspecified: Secondary | ICD-10-CM | POA: Insufficient documentation

## 2016-10-17 DIAGNOSIS — Z79899 Other long term (current) drug therapy: Secondary | ICD-10-CM | POA: Diagnosis not present

## 2016-10-17 DIAGNOSIS — Z87891 Personal history of nicotine dependence: Secondary | ICD-10-CM | POA: Diagnosis not present

## 2016-10-17 DIAGNOSIS — J069 Acute upper respiratory infection, unspecified: Secondary | ICD-10-CM | POA: Diagnosis not present

## 2016-10-17 LAB — CBC
HCT: 44 % (ref 39.0–52.0)
HEMOGLOBIN: 15.3 g/dL (ref 13.0–17.0)
MCH: 30.6 pg (ref 26.0–34.0)
MCHC: 34.8 g/dL (ref 30.0–36.0)
MCV: 88 fL (ref 78.0–100.0)
PLATELETS: 372 10*3/uL (ref 150–400)
RBC: 5 MIL/uL (ref 4.22–5.81)
RDW: 13.6 % (ref 11.5–15.5)
WBC: 8 10*3/uL (ref 4.0–10.5)

## 2016-10-17 LAB — BASIC METABOLIC PANEL
Anion gap: 9 (ref 5–15)
BUN: 19 mg/dL (ref 6–20)
CHLORIDE: 98 mmol/L — AB (ref 101–111)
CO2: 30 mmol/L (ref 22–32)
CREATININE: 0.84 mg/dL (ref 0.61–1.24)
Calcium: 10.2 mg/dL (ref 8.9–10.3)
GFR calc non Af Amer: >60 mL/min (ref >60)
Glucose, Bld: 92 mg/dL (ref 65–99)
Potassium: 4.2 mmol/L (ref 3.5–5.1)
Sodium: 137 mmol/L (ref 135–145)

## 2016-10-17 MED ORDER — SODIUM CHLORIDE 0.9 % IJ SOLN
INTRAMUSCULAR | Status: AC
  Filled 2016-10-17: qty 50

## 2016-10-17 MED ORDER — IOPAMIDOL (ISOVUE-300) INJECTION 61%
75.0000 mL | Freq: Once | INTRAVENOUS | Status: AC | PRN
  Administered 2016-10-17: 75 mL via INTRAVENOUS

## 2016-10-17 MED ORDER — IOPAMIDOL (ISOVUE-300) INJECTION 61%
INTRAVENOUS | Status: AC
  Filled 2016-10-17: qty 75

## 2016-10-17 MED ORDER — PREDNISONE 10 MG PO TABS: 20.0000 mg | ORAL_TABLET | Freq: Two times a day (BID) | ORAL | 0 refills | Status: AC

## 2016-10-17 MED ORDER — ACETAMINOPHEN 325 MG PO TABS
650.0000 mg | ORAL_TABLET | Freq: Once | ORAL | Status: AC
  Administered 2016-10-17: 650 mg via ORAL
  Filled 2016-10-17: qty 2

## 2016-10-17 MED ORDER — AMOXICILLIN-POT CLAVULANATE 875-125 MG PO TABS: 1.0000 | ORAL_TABLET | Freq: Two times a day (BID) | ORAL | 0 refills | Status: AC

## 2016-10-17 NOTE — Discharge Instructions (Signed)
Please take Augmentin twice a day for 7 days. Please take prednisone 2 tablets twice a day for 5 days. Please make sure to take full course of treatment without fail. Drink plenty of fluids throughout the day. Go to your scheduled ENT appointment in 2 weeks.  Get help right away if: You develop any new symptoms such as vomiting, severe headache, stiff neck, chest pain, or trouble breathing or swallowing. You have severe throat pain along with drooling or voice changes. You have severe pain, unrelieved with recommended medications. You are unable to fully open the mouth. You develop redness, swelling, or severe pain anywhere in the neck. You have a fever.

## 2016-10-17 NOTE — ED Triage Notes (Signed)
Patient c/o sore throat x month. Patient has been on many different medications that arent taking it away.  Patient states he came yesterday to be seen but left after waiting 3 hours.

## 2016-10-17 NOTE — ED Provider Notes (Signed)
[facility]-EMERGENCY DEPT Provider Note   CSN: [id number] Arrival date & time: 10/17/16  [id number]     History   Chief Complaint Chief Complaint  Patient presents with  . Sore Throat    HPI Devin Chang is a 26 y.o. male presents today to the ED with complaints of sore throat x 1 month. He reports his pan as worsening, constant, soreness, of 6/10 severity. He reports associated trouble swallowing solids, painful swallowing, congestion, rhinorrhea. Denies fevers, chills, nausea, vomiting, difficulty breathing. He states he is able to handle liquids without problem. He reports 4 visits to the urgent care with 4 different providers with worsening of his symptoms. He states he was given amoxicillin and stopped after 5 days when his symptoms improved then later his symptoms worsened and went back to urgent care. He states that he was then given clarithromycin which made his symptoms worse, went back to urgent care yesterday and was told to continue clarithromycin use and to come to the emergency department yesterday. Patient was here yesterday in the left in triage after waiting for 3 hours and decided to come back today. He reports seeing an ENT last Monday for other complaints and had him take a look at his throat while he was there an ENT recommended to continue anabiotic's he was given and to update him 2 days after. 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 She recommended that he see another ENT close by. He reports having an appointment with ENT throat specialist in 2 weeks.  The history is provided by the patient. No language interpreter was used.    Past Medical History:  Diagnosis Date  . Anxiety   . Depression   . Hearing loss    left ear  . Panic attacks   . Right  knee injury 2012   small chondral injury, right arthroscopy in 01/2012    Patient Active Problem List   Diagnosis Date Noted  . Anxiety and depression 08/31/2015  . ADD (attention deficit disorder) 08/31/2015  . PCP NOTES >>>>>>> 08/31/2015    Past Surgical History:  Procedure Laterality Date  . KNEE ARTHROSCOPY W/ DEBRIDEMENT Right 01/16/2012   w/ [place] release, at [facility] in [place], [place]  . [place]  2012   Dr. Retta Mac[place]orn [place]       Home Medications    Prior to Admission medications   Medication Sig Start Date End Date Taking? Authorizing Provider  amphetamine-dextroamphetamine (ADDERALL) 20 MG tablet Take 20 mg by mouth daily.   Yes Historical Provider, MD  clarithromycin (BIAXIN XL) 500 MG 24 hr tablet Take 2 tablets (1,000 mg total) by mouth daily. 10/10/16  Yes Peyton Najjaravid H Hopper, MD  escitalopram (LEXAPRO) 20 MG tablet Take 20 mg by mouth daily.  09/15/16  Yes Historical Provider, MD  lidocaine (XYLOCAINE) 2 % solution Use as directed 20 mLs in the mouth or throat every 4 (four) hours as needed for mouth pain. 09/22/16  Yes Ofilia NeasMichael L Clark, PA-C  triamterene-hydrochlorothiazide (DYAZIDE) 37.5-25 MG capsule Take 1 capsule by mouth daily.  10/06/16  Yes Historical Provider, MD  amoxicillin-clavulanate (AUGMENTIN) 875-125 MG tablet Take 1 tablet by mouth 2 (two) times daily. 10/17/16 10/24/16  Alvina ChouFrancisco Manuel Conway [place], [place]  fluticasone (FLONASE) 50 MCG/ACT nasal spray Place 2 sprays into both nostrils daily. Patient not taking: Reported on 10/17/2016 10/10/16   Peyton Najjar, MD  predniSONE (DELTASONE) 10 MG tablet Take 2 tablets (20 mg total) by mouth 2 (two) times daily with a meal. 10/17/16 10/22/16  Alvina Chou, [place]    Family History Family History  Problem Relation Age of Onset  . Arthritis Sister   . Depression Sister   . Cancer Maternal Uncle     colon cancer  . Hypertension Maternal Grandmother   . Hyperlipidemia Maternal Grandmother   . Dementia Paternal  Grandmother   . Cancer Paternal Grandfather     esophageal cancer  . Suicidality Neg Hx     Social History Social History  Substance Use Topics  . Smoking status: Former Games developer  . Smokeless tobacco: Former Neurosurgeon     Comment: no tobacco at present   . Alcohol use 0.0 oz/week     Comment: sporadic etoh     Allergies   Patient has no known allergies.   Review of Systems Review of Systems  Constitutional: Negative for chills and fever.  HENT: Positive for congestion, rhinorrhea, sore throat and trouble swallowing.   Respiratory: Negative for shortness of breath and stridor.   Gastrointestinal: Negative for abdominal pain, diarrhea, nausea and vomiting.  Skin: Negative for wound.     Physical Exam Updated Vital Signs BP 111/66 (BP Location: Left Arm)   Pulse 74   Temp 97.8 F (36.6 C) (Oral)   Resp 12   Ht 6' (1.829 m)   Wt 73.5 kg   SpO2 100%   BMI 21.97 kg/m   Physical Exam  Constitutional: He is oriented to person, place, and time. He appears well-developed and well-nourished.  Well appearing.  Mild "hot potato voice" noted   HENT:  Head: Normocephalic and atraumatic.  Right Ear: External ear normal.  Left Ear: External ear normal.  Nose: Nose normal.  Mouth/Throat: Oropharynx is clear and moist. No oropharyngeal exudate.  Trismus noted. Oropharynx with evidence of significant peritonsillar swelling. Tonsils difficult to visualize, but did not appear to have evidence of redness, swelling, or exudates.  TM's appear normal with no evidence of bulging. EAC appear non erythematous and not swollen  Eyes: EOM are normal. Pupils are equal, round, and reactive to light.  Neck: Normal range of motion.  Normal ROM. No nuchal rigidity. No neck tenderness  Cardiovascular: Normal rate and normal heart sounds.   Pulmonary/Chest: Effort normal and breath sounds normal. No respiratory distress. He has no wheezes. He has no rales.  Lungs CTA. No wheezing. No rales. No stridor.  Normal work of breathing  Abdominal: Soft. There is no tenderness. There is no rebound and no guarding.  Soft and nontender. No rebound. No guarding. Negative murphy's sign. No focal tenderness at McBurney's point. No CVA tenderness. No evidence of hernia  Neurological: He is alert and oriented to person, place, and time.  Skin: Skin is warm.  Psychiatric: He has a normal mood and affect. His behavior is normal.  Nursing note and vitals reviewed.    ED Treatments / Results  Labs (all labs ordered are listed, but only abnormal results are displayed) Labs Reviewed  BASIC METABOLIC PANEL - Abnormal; Notable for the following:       Result Value   Chloride 98 (*)    All other components within normal limits  CBC    EKG  EKG Interpretation None  Radiology Ct Soft Tissue Neck W Contrast  Result Date: 10/17/2016 CLINICAL DATA:  Sore throat for a month.  Peritonsillar swelling. EXAM: CT NECK WITH CONTRAST TECHNIQUE: Multidetector CT imaging of the neck was performed using the standard protocol following the bolus administration of intravenous contrast. CONTRAST:  75mL ISOVUE-300 IOPAMIDOL (ISOVUE-300) INJECTION 61% COMPARISON:  None. FINDINGS: Pharynx and larynx: Enlargement of the tonsillar tissue, most notable at the faucial tonsils where there is greater enlargement on the left. No peritonsillar abscess. Mucosal enhancement is relatively mild for the degree of thickening. No laryngeal edema or airway effacement. Salivary glands: No inflammation, mass, or stone. Thyroid: Normal. Lymph nodes: Bilateral enlargement, greatest at the jugulodigastric nodes which have some heterogeneous enhancement but no cavity. Vascular: No venous thrombosis or other significant finding. Limited intracranial: Negative Visualized orbits: Not seen Mastoids and visualized paranasal sinuses: Clear where seen Skeleton: Negative Upper chest: Negative.  No pneumonia in the visualized lungs. IMPRESSION: Tonsillar  thickening and upper cervical adenopathy, usually tonsillitis and reactive cervical adenitis. No abscess. Reportedly the swelling has been present for a month and unresponsive to medications. If patient does not respond antibiotics, consider biopsy to exclude a lymphoproliferative process. Electronically Signed   By: Marnee Spring M.D.   On: 10/17/2016 11:29    Procedures Procedures (including critical care time)  Medications Ordered in ED Medications  iopamidol (ISOVUE-300) 61 % injection (not administered)  sodium chloride 0.9 % injection (not administered)  acetaminophen (TYLENOL) tablet 650 mg (650 mg Oral Given 10/17/16 0933)  iopamidol (ISOVUE-300) 61 % injection 75 mL (75 mLs Intravenous Contrast Given 10/17/16 1103)     Initial Impression / Assessment and Plan / ED Course  I have reviewed the triage vital signs and the nursing notes.  Pertinent labs & imaging results that were available during my care of the patient were reviewed by me and considered in my medical decision making (see chart for details).      Patient here with likely tonsillitis. Initially suspicious for peritonsillar abscess or retropharyngeal abscess. On exam patient had mild uptake of voice and trismus. Afebrile, no apparent distress, vital signs stable. Patient able to breathe without difficulty. Patient able to handle secretions without difficulty. Patient able to swallow liquids without difficulty. Able to speak in full sentences without any difficulty. Heart and lung sounds clear. CT showed Tonsillar thickening and upper cervical adenopathy, usually tonsillitis and reactive cervical adenitis. No abscess if patient does not respond antibiotics, ED should consider biopsy to the exclude a lymphoproliferative process. Patient is to follow up with his scheduled appointment in 2 weeks his ENT physician. Patient will be given antibiotics and steroids to decrease inflammation. Patient understood. Instructions given for  immediate reasons to return to Ed. Patient also seen and evaluated by Dr. Effie Shy.   Final Clinical Impressions(s) / ED Diagnoses   Final diagnoses:  Tonsillitis    New Prescriptions New Prescriptions   AMOXICILLIN-CLAVULANATE (AUGMENTIN) 875-125 MG TABLET    Take 1 tablet by mouth 2 (two) times daily.   PREDNISONE (DELTASONE) 10 MG TABLET    Take 2 tablets (20 mg total) by mouth 2 (two) times daily with a meal.     [address] [place], [place] 10/17/16 1359    Mancel Bale, MD 10/20/16 [phone]

## 2016-10-17 NOTE — ED Provider Notes (Signed)
  Face-to-face evaluation   History: Sore throat for 1 month, intermittently treated with amoxicillin and clindamycin. Pain with swallowing, but able to do it. No shortness of breath, or reported symptoms of stridor.  Physical exam: Alert, cooperative. Oral airway is patent. No stridor. Mild bilateral peritonsillar swelling and erythema. No exudate.  Medical screening examination/treatment/procedure(s) were conducted as a shared visit with non-physician practitioner(s) and myself.  I personally evaluated the patient during the encounter   Mancel BaleElliott Yisel Megill, MD 10/20/16 [phone]

## 2016-10-18 LAB — EPSTEIN-BARR VIRUS VCA ANTIBODY PANEL
EBV Early Antigen Ab, IgG: 28 U/mL — ABNORMAL HIGH (ref 0.0–8.9)
EBV NA IgG: 169 U/mL — ABNORMAL HIGH (ref 0.0–17.9)
EBV VCA IgM: <36 U/mL (ref 0.0–35.9)

## 2016-10-31 DIAGNOSIS — J039 Acute tonsillitis, unspecified: Secondary | ICD-10-CM | POA: Diagnosis not present

## 2016-11-07 DIAGNOSIS — F411 Generalized anxiety disorder: Secondary | ICD-10-CM | POA: Diagnosis not present

## 2016-11-17 DIAGNOSIS — H8103 Meniere's disease, bilateral: Secondary | ICD-10-CM | POA: Diagnosis not present

## 2016-11-17 DIAGNOSIS — H903 Sensorineural hearing loss, bilateral: Secondary | ICD-10-CM | POA: Diagnosis not present

## 2016-11-21 DIAGNOSIS — F411 Generalized anxiety disorder: Secondary | ICD-10-CM | POA: Diagnosis not present

## 2016-11-27 ENCOUNTER — Other Ambulatory Visit: Payer: Self-pay | Admitting: Otolaryngology

## 2016-11-27 ENCOUNTER — Ambulatory Visit
Admission: RE | Admit: 2016-11-27 | Discharge: 2016-11-27 | Disposition: A | Discharge status: Home / Self Care | Payer: 59 | Source: Ambulatory Visit | Attending: Otolaryngology | Admitting: Otolaryngology

## 2016-11-27 DIAGNOSIS — L0291 Cutaneous abscess, unspecified: Secondary | ICD-10-CM

## 2016-11-27 DIAGNOSIS — J36 Peritonsillar abscess: Secondary | ICD-10-CM

## 2016-11-27 DIAGNOSIS — J039 Acute tonsillitis, unspecified: Secondary | ICD-10-CM | POA: Diagnosis not present

## 2016-11-27 IMAGING — CT CT NECK W/ CM
4 of 5 series · 16 of 33 positions shown, 19 images · IV contrast (75CC ISOVUE 300)
Comparison: CT neck [DATE]

CLINICAL DATA: Peritonsillar abscess.  Dysphagia and hoarseness.

EXAM:
CT NECK WITH CONTRAST
TECHNIQUE: Multidetector CT imaging of the neck was performed using the
standard protocol following the bolus administration of intravenous
contrast.
CONTRAST:  75mL [CA] IOPAMIDOL ([CA]) INJECTION 61%

[Series 3: axial neck · axial · 0.59mm/px · z∈[+56,+121]mm · 2 of 104 slices shown]
[im 26/104  bone]
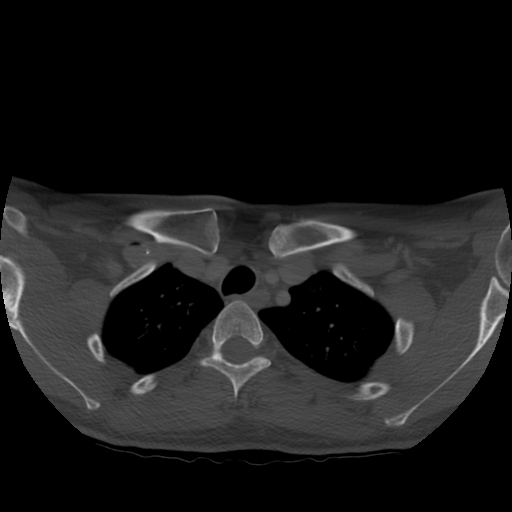
[im 52/104  bone]
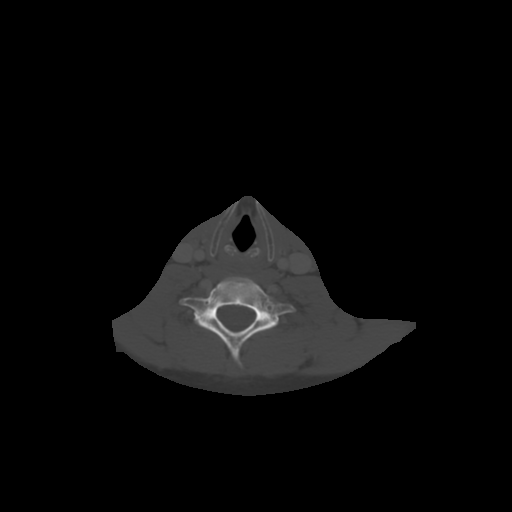

[Series 200: coronal · coronal · 0.59mm/px · 3 of 112 slices shown]
[im 37/112  bone]
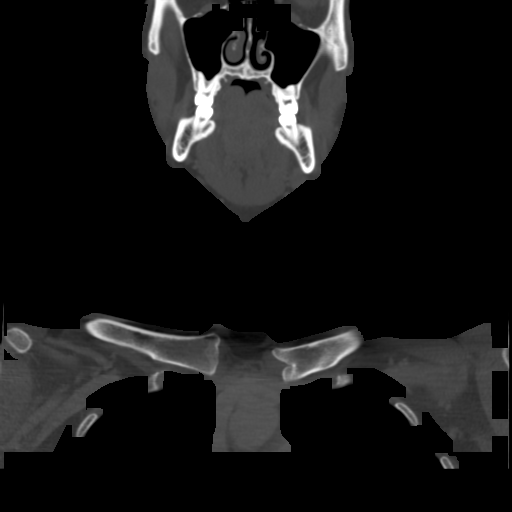
[im 50/112  bone]
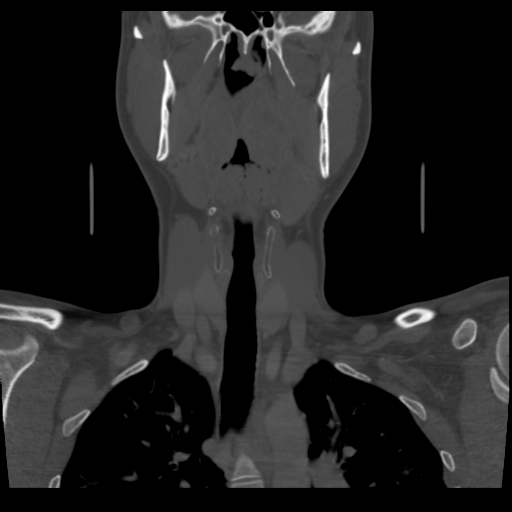
[im 62/112  bone]
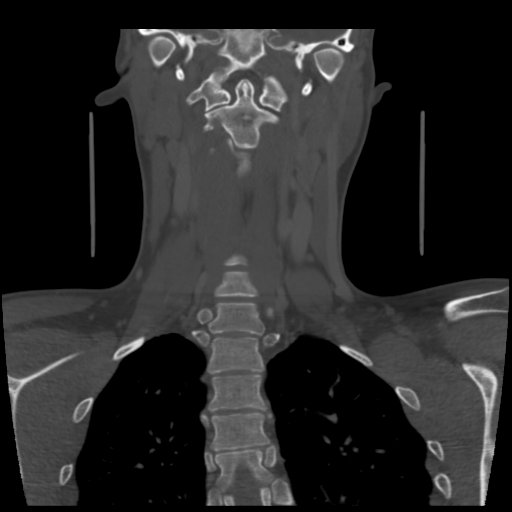

[Series 202: sagittal · sagittal · 0.59mm/px · 5 of 82 slices shown, 6 images]
[im 28/82  bone]
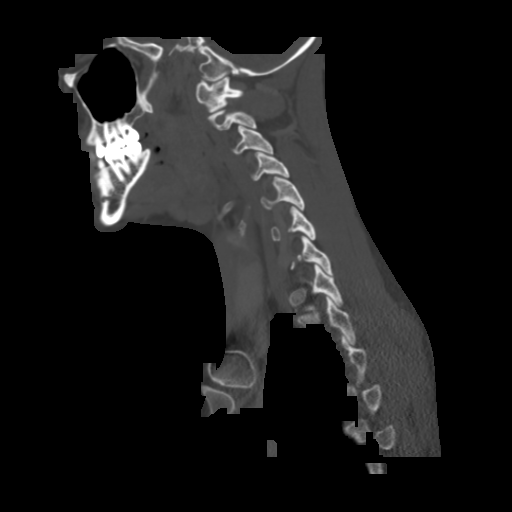
[im 34/82  bone]
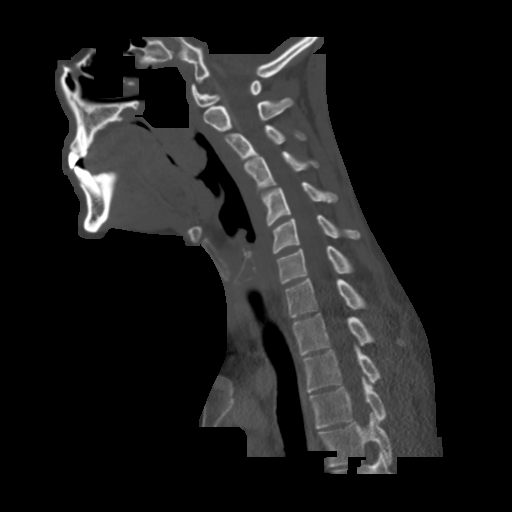
[im 41/82  soft-tissue]
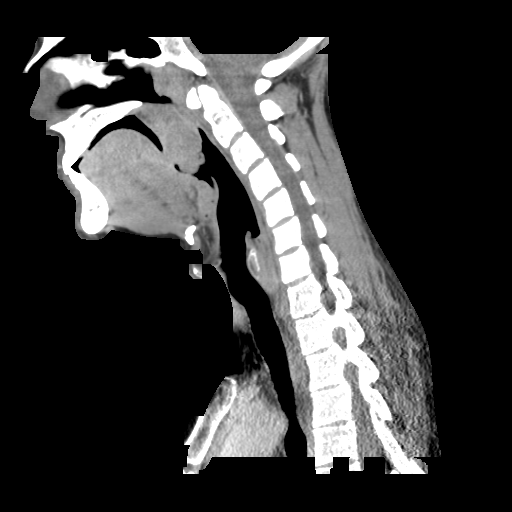
[im 41/82  bone]
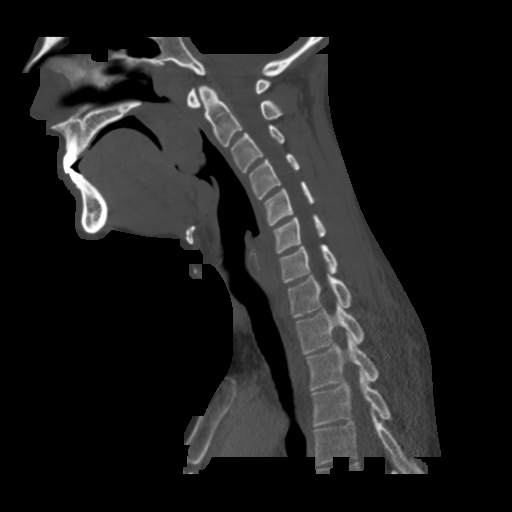
[im 48/82  bone]
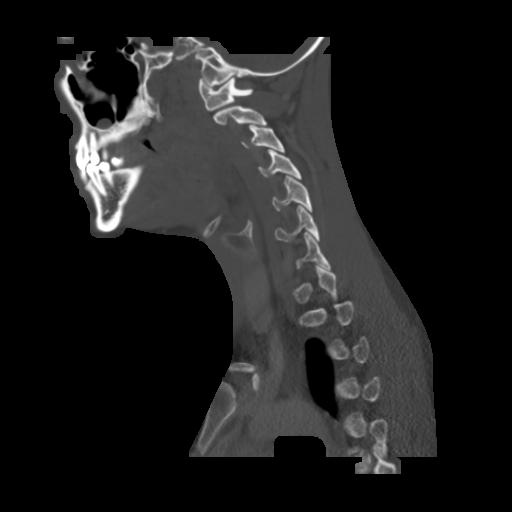
[im 55/82  bone]
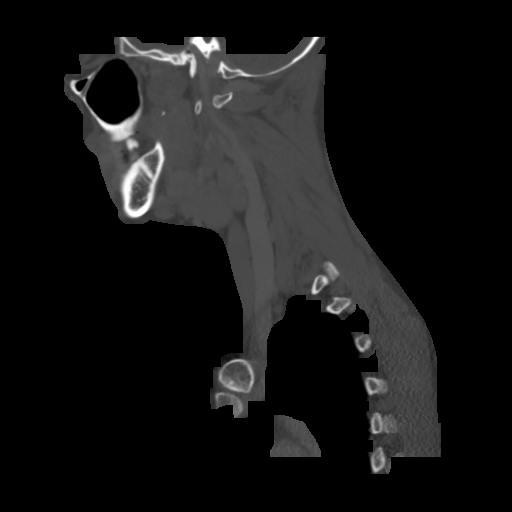

[Series 203: angle for hyoid · axial · 0.59mm/px · z∈[-21,+172]mm · 6 of 150 slices shown, 8 images]
[im 22/150  soft-tissue]
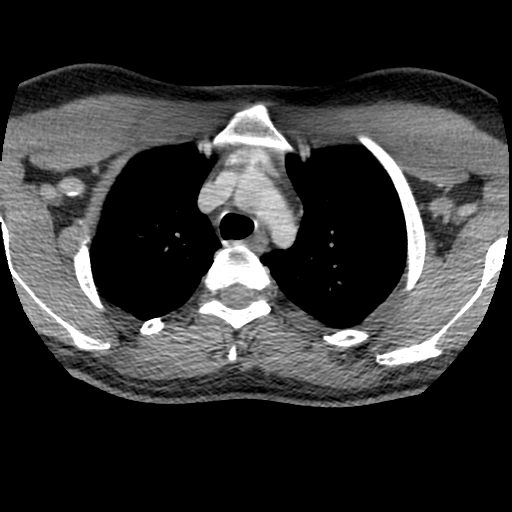
[im 22/150  bone]
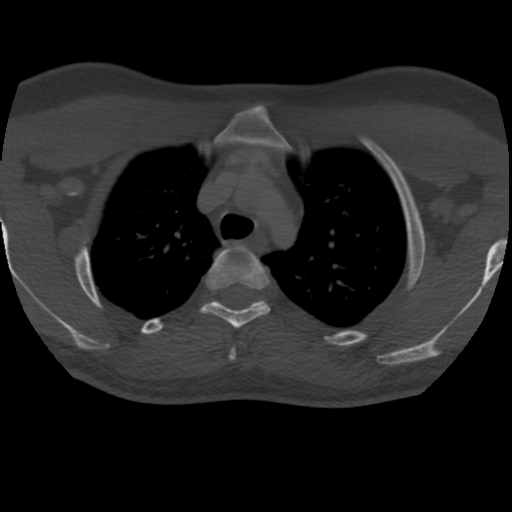
[im 43/150  bone]
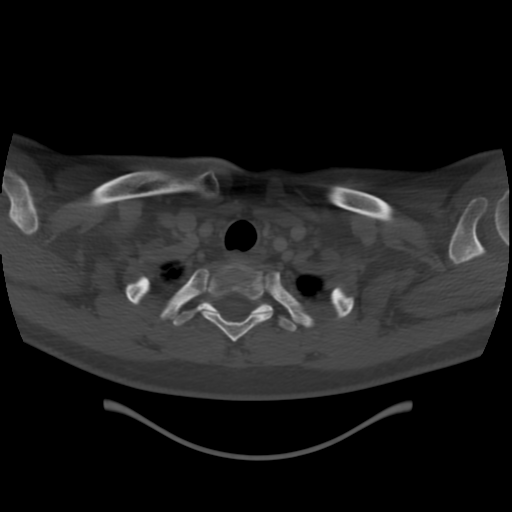
[im 64/150  bone]
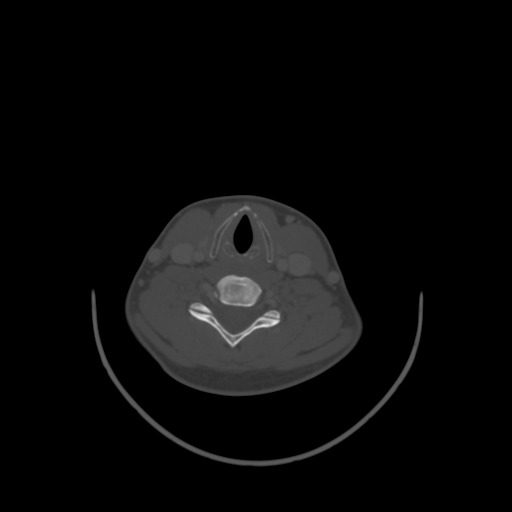
[im 86/150  bone]
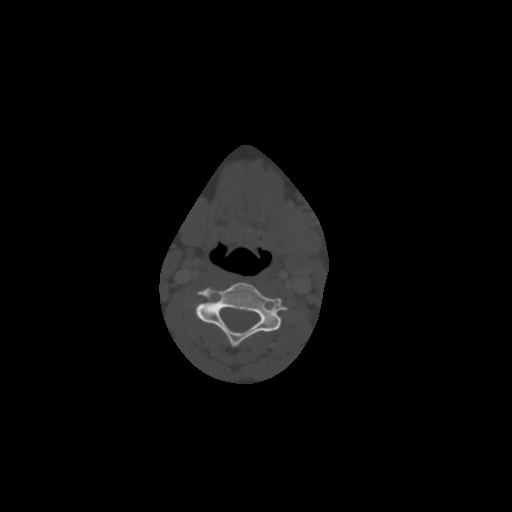
[im 107/150  soft-tissue]
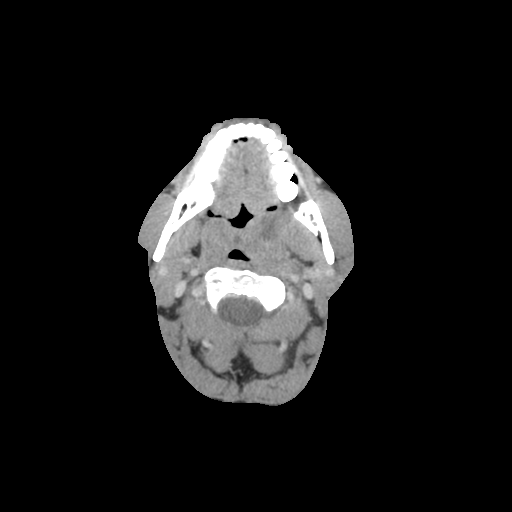
[im 107/150  bone]
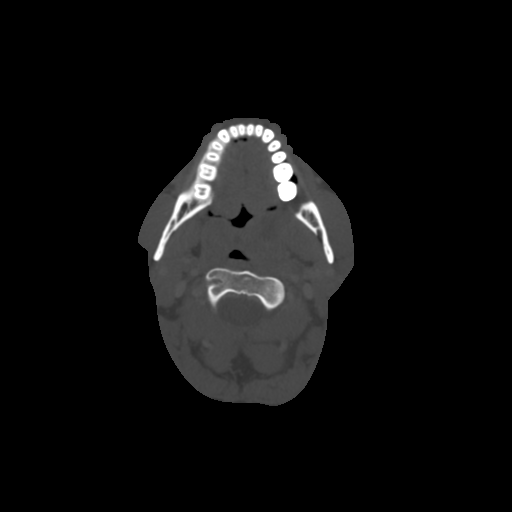
[im 128/150  bone]
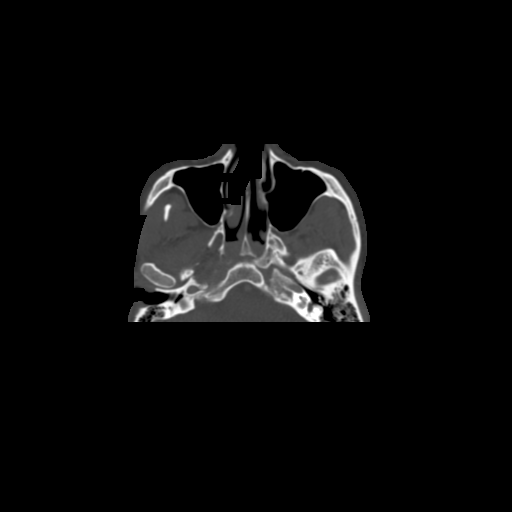

[16 of 33 positions shown; findings below may reference images not displayed]

FINDINGS: Pharynx and larynx: Symmetric and diffuse enlargement of the adenoid
and tonsils bilaterally. Low-density fluid collection in the left
tonsil measuring 13 mm compatible with peritonsillar abscess. This
has developed since the prior study. Enlargement of the soft palate.
Airway intact. Epiglottis and larynx normal.

Salivary glands: Negative

Thyroid: Negative

Lymph nodes: Right level 2 lymph node 15 mm with mild improvement
from the prior study. Left level 2 lymph node 15 mm without
significant change. Multiple 5-7 mm posterior lymph nodes in the
neck bilaterally similar to the prior study. No liquefied lymph
nodes identified.

Vascular: Negative

Limited intracranial: Negative

Visualized orbits: Negative

Mastoids and visualized paranasal sinuses: Mild mucosal edema in the
base of the left maxillary sinus. Remaining sinuses clear.

Skeleton: Cervical kyphosis likely positional. No acute bony
abnormality.

Upper chest: Lung apices clear

Other: None
IMPRESSION: 13 mm left peritonsillar abscess. Symmetric and diffuse enlargement
of the adenoid and tonsil bilaterally as noted previously.

Reactive adenopathy in neck similar to the prior study most likely
due to tonsillitis and infection.

## 2016-11-27 MED ORDER — IOPAMIDOL (ISOVUE-300) INJECTION 61%
75.0000 mL | Freq: Once | INTRAVENOUS | Status: AC | PRN
  Administered 2016-11-27: 75 mL via INTRAVENOUS

## 2016-12-04 DIAGNOSIS — J351 Hypertrophy of tonsils: Secondary | ICD-10-CM | POA: Diagnosis not present

## 2016-12-04 DIAGNOSIS — J3501 Chronic tonsillitis: Secondary | ICD-10-CM | POA: Diagnosis not present

## 2016-12-12 DIAGNOSIS — F411 Generalized anxiety disorder: Secondary | ICD-10-CM | POA: Diagnosis not present

## 2016-12-26 DIAGNOSIS — F411 Generalized anxiety disorder: Secondary | ICD-10-CM | POA: Diagnosis not present

## 2017-01-09 DIAGNOSIS — F411 Generalized anxiety disorder: Secondary | ICD-10-CM | POA: Diagnosis not present

## 2017-01-30 DIAGNOSIS — F411 Generalized anxiety disorder: Secondary | ICD-10-CM | POA: Diagnosis not present

## 2017-02-13 DIAGNOSIS — F411 Generalized anxiety disorder: Secondary | ICD-10-CM | POA: Diagnosis not present

## 2017-02-27 DIAGNOSIS — F411 Generalized anxiety disorder: Secondary | ICD-10-CM | POA: Diagnosis not present

## 2017-03-13 DIAGNOSIS — F411 Generalized anxiety disorder: Secondary | ICD-10-CM | POA: Diagnosis not present

## 2017-03-20 DIAGNOSIS — F411 Generalized anxiety disorder: Secondary | ICD-10-CM | POA: Diagnosis not present

## 2017-03-20 DIAGNOSIS — F529 Unspecified sexual dysfunction not due to a substance or known physiological condition: Secondary | ICD-10-CM | POA: Diagnosis not present

## 2017-03-20 DIAGNOSIS — F341 Dysthymic disorder: Secondary | ICD-10-CM | POA: Diagnosis not present

## 2017-03-30 DIAGNOSIS — F411 Generalized anxiety disorder: Secondary | ICD-10-CM | POA: Diagnosis not present

## 2017-03-30 DIAGNOSIS — F341 Dysthymic disorder: Secondary | ICD-10-CM | POA: Diagnosis not present

## 2017-03-30 DIAGNOSIS — F529 Unspecified sexual dysfunction not due to a substance or known physiological condition: Secondary | ICD-10-CM | POA: Diagnosis not present

## 2017-04-03 DIAGNOSIS — F411 Generalized anxiety disorder: Secondary | ICD-10-CM | POA: Diagnosis not present

## 2017-04-06 DIAGNOSIS — F411 Generalized anxiety disorder: Secondary | ICD-10-CM | POA: Diagnosis not present

## 2017-04-10 DIAGNOSIS — F341 Dysthymic disorder: Secondary | ICD-10-CM | POA: Diagnosis not present

## 2017-04-10 DIAGNOSIS — F411 Generalized anxiety disorder: Secondary | ICD-10-CM | POA: Diagnosis not present

## 2017-04-10 DIAGNOSIS — F529 Unspecified sexual dysfunction not due to a substance or known physiological condition: Secondary | ICD-10-CM | POA: Diagnosis not present

## 2017-04-15 DIAGNOSIS — F529 Unspecified sexual dysfunction not due to a substance or known physiological condition: Secondary | ICD-10-CM | POA: Diagnosis not present

## 2017-04-15 DIAGNOSIS — F411 Generalized anxiety disorder: Secondary | ICD-10-CM | POA: Diagnosis not present

## 2017-04-15 DIAGNOSIS — F341 Dysthymic disorder: Secondary | ICD-10-CM | POA: Diagnosis not present

## 2017-04-17 DIAGNOSIS — F411 Generalized anxiety disorder: Secondary | ICD-10-CM | POA: Diagnosis not present

## 2017-04-21 DIAGNOSIS — F529 Unspecified sexual dysfunction not due to a substance or known physiological condition: Secondary | ICD-10-CM | POA: Diagnosis not present

## 2017-04-21 DIAGNOSIS — F341 Dysthymic disorder: Secondary | ICD-10-CM | POA: Diagnosis not present

## 2017-04-21 DIAGNOSIS — F411 Generalized anxiety disorder: Secondary | ICD-10-CM | POA: Diagnosis not present

## 2017-04-27 DIAGNOSIS — F529 Unspecified sexual dysfunction not due to a substance or known physiological condition: Secondary | ICD-10-CM | POA: Diagnosis not present

## 2017-04-27 DIAGNOSIS — F341 Dysthymic disorder: Secondary | ICD-10-CM | POA: Diagnosis not present

## 2017-04-27 DIAGNOSIS — F411 Generalized anxiety disorder: Secondary | ICD-10-CM | POA: Diagnosis not present

## 2017-04-30 DIAGNOSIS — F341 Dysthymic disorder: Secondary | ICD-10-CM | POA: Diagnosis not present

## 2017-04-30 DIAGNOSIS — F529 Unspecified sexual dysfunction not due to a substance or known physiological condition: Secondary | ICD-10-CM | POA: Diagnosis not present

## 2017-04-30 DIAGNOSIS — F411 Generalized anxiety disorder: Secondary | ICD-10-CM | POA: Diagnosis not present

## 2017-05-01 DIAGNOSIS — F411 Generalized anxiety disorder: Secondary | ICD-10-CM | POA: Diagnosis not present

## 2017-05-04 DIAGNOSIS — F411 Generalized anxiety disorder: Secondary | ICD-10-CM | POA: Diagnosis not present

## 2017-05-04 DIAGNOSIS — F341 Dysthymic disorder: Secondary | ICD-10-CM | POA: Diagnosis not present

## 2017-05-04 DIAGNOSIS — F529 Unspecified sexual dysfunction not due to a substance or known physiological condition: Secondary | ICD-10-CM | POA: Diagnosis not present

## 2017-05-13 DIAGNOSIS — F529 Unspecified sexual dysfunction not due to a substance or known physiological condition: Secondary | ICD-10-CM | POA: Diagnosis not present

## 2017-05-13 DIAGNOSIS — F411 Generalized anxiety disorder: Secondary | ICD-10-CM | POA: Diagnosis not present

## 2017-05-13 DIAGNOSIS — F341 Dysthymic disorder: Secondary | ICD-10-CM | POA: Diagnosis not present

## 2017-05-18 DIAGNOSIS — F411 Generalized anxiety disorder: Secondary | ICD-10-CM | POA: Diagnosis not present

## 2017-05-18 DIAGNOSIS — F341 Dysthymic disorder: Secondary | ICD-10-CM | POA: Diagnosis not present

## 2017-05-18 DIAGNOSIS — F529 Unspecified sexual dysfunction not due to a substance or known physiological condition: Secondary | ICD-10-CM | POA: Diagnosis not present

## 2017-05-26 DIAGNOSIS — F411 Generalized anxiety disorder: Secondary | ICD-10-CM | POA: Diagnosis not present

## 2017-05-27 DIAGNOSIS — F411 Generalized anxiety disorder: Secondary | ICD-10-CM | POA: Diagnosis not present

## 2017-05-27 DIAGNOSIS — F529 Unspecified sexual dysfunction not due to a substance or known physiological condition: Secondary | ICD-10-CM | POA: Diagnosis not present

## 2017-05-27 DIAGNOSIS — F341 Dysthymic disorder: Secondary | ICD-10-CM | POA: Diagnosis not present

## 2017-06-01 DIAGNOSIS — F529 Unspecified sexual dysfunction not due to a substance or known physiological condition: Secondary | ICD-10-CM | POA: Diagnosis not present

## 2017-06-01 DIAGNOSIS — F341 Dysthymic disorder: Secondary | ICD-10-CM | POA: Diagnosis not present

## 2017-06-01 DIAGNOSIS — F411 Generalized anxiety disorder: Secondary | ICD-10-CM | POA: Diagnosis not present

## 2017-06-04 DIAGNOSIS — F341 Dysthymic disorder: Secondary | ICD-10-CM | POA: Diagnosis not present

## 2017-06-04 DIAGNOSIS — F529 Unspecified sexual dysfunction not due to a substance or known physiological condition: Secondary | ICD-10-CM | POA: Diagnosis not present

## 2017-06-04 DIAGNOSIS — F411 Generalized anxiety disorder: Secondary | ICD-10-CM | POA: Diagnosis not present

## 2017-06-08 DIAGNOSIS — F341 Dysthymic disorder: Secondary | ICD-10-CM | POA: Diagnosis not present

## 2017-06-08 DIAGNOSIS — F411 Generalized anxiety disorder: Secondary | ICD-10-CM | POA: Diagnosis not present

## 2017-06-08 DIAGNOSIS — F529 Unspecified sexual dysfunction not due to a substance or known physiological condition: Secondary | ICD-10-CM | POA: Diagnosis not present

## 2017-06-10 DIAGNOSIS — F411 Generalized anxiety disorder: Secondary | ICD-10-CM | POA: Diagnosis not present

## 2017-06-10 DIAGNOSIS — F529 Unspecified sexual dysfunction not due to a substance or known physiological condition: Secondary | ICD-10-CM | POA: Diagnosis not present

## 2017-06-10 DIAGNOSIS — F341 Dysthymic disorder: Secondary | ICD-10-CM | POA: Diagnosis not present

## 2017-06-15 DIAGNOSIS — F341 Dysthymic disorder: Secondary | ICD-10-CM | POA: Diagnosis not present

## 2017-06-15 DIAGNOSIS — F411 Generalized anxiety disorder: Secondary | ICD-10-CM | POA: Diagnosis not present

## 2017-06-15 DIAGNOSIS — F529 Unspecified sexual dysfunction not due to a substance or known physiological condition: Secondary | ICD-10-CM | POA: Diagnosis not present

## 2017-06-18 DIAGNOSIS — F341 Dysthymic disorder: Secondary | ICD-10-CM | POA: Diagnosis not present

## 2017-06-18 DIAGNOSIS — F529 Unspecified sexual dysfunction not due to a substance or known physiological condition: Secondary | ICD-10-CM | POA: Diagnosis not present

## 2017-06-18 DIAGNOSIS — F411 Generalized anxiety disorder: Secondary | ICD-10-CM | POA: Diagnosis not present

## 2017-06-22 DIAGNOSIS — F4010 Social phobia, unspecified: Secondary | ICD-10-CM | POA: Diagnosis not present

## 2017-06-22 DIAGNOSIS — F1210 Cannabis abuse, uncomplicated: Secondary | ICD-10-CM | POA: Diagnosis not present

## 2017-06-22 DIAGNOSIS — F329 Major depressive disorder, single episode, unspecified: Secondary | ICD-10-CM | POA: Diagnosis not present

## 2017-06-23 DIAGNOSIS — F411 Generalized anxiety disorder: Secondary | ICD-10-CM | POA: Diagnosis not present

## 2017-06-23 DIAGNOSIS — F341 Dysthymic disorder: Secondary | ICD-10-CM | POA: Diagnosis not present

## 2017-06-23 DIAGNOSIS — F529 Unspecified sexual dysfunction not due to a substance or known physiological condition: Secondary | ICD-10-CM | POA: Diagnosis not present

## 2017-06-26 DIAGNOSIS — F411 Generalized anxiety disorder: Secondary | ICD-10-CM | POA: Diagnosis not present

## 2017-06-29 DIAGNOSIS — F411 Generalized anxiety disorder: Secondary | ICD-10-CM | POA: Diagnosis not present

## 2017-07-06 DIAGNOSIS — F411 Generalized anxiety disorder: Secondary | ICD-10-CM | POA: Diagnosis not present

## 2017-07-10 DIAGNOSIS — F411 Generalized anxiety disorder: Secondary | ICD-10-CM | POA: Diagnosis not present

## 2017-07-13 DIAGNOSIS — F411 Generalized anxiety disorder: Secondary | ICD-10-CM | POA: Diagnosis not present

## 2017-07-17 DIAGNOSIS — F411 Generalized anxiety disorder: Secondary | ICD-10-CM | POA: Diagnosis not present

## 2017-07-21 DIAGNOSIS — F411 Generalized anxiety disorder: Secondary | ICD-10-CM | POA: Diagnosis not present

## 2017-07-24 DIAGNOSIS — F411 Generalized anxiety disorder: Secondary | ICD-10-CM | POA: Diagnosis not present

## 2017-07-27 DIAGNOSIS — F411 Generalized anxiety disorder: Secondary | ICD-10-CM | POA: Diagnosis not present

## 2017-07-29 DIAGNOSIS — F411 Generalized anxiety disorder: Secondary | ICD-10-CM | POA: Diagnosis not present

## 2017-08-03 DIAGNOSIS — F411 Generalized anxiety disorder: Secondary | ICD-10-CM | POA: Diagnosis not present

## 2017-08-06 DIAGNOSIS — F4010 Social phobia, unspecified: Secondary | ICD-10-CM | POA: Diagnosis not present

## 2017-08-06 DIAGNOSIS — F1210 Cannabis abuse, uncomplicated: Secondary | ICD-10-CM | POA: Diagnosis not present

## 2017-08-06 DIAGNOSIS — F329 Major depressive disorder, single episode, unspecified: Secondary | ICD-10-CM | POA: Diagnosis not present

## 2017-08-07 DIAGNOSIS — F411 Generalized anxiety disorder: Secondary | ICD-10-CM | POA: Diagnosis not present

## 2017-08-11 DIAGNOSIS — F411 Generalized anxiety disorder: Secondary | ICD-10-CM | POA: Diagnosis not present

## 2017-08-13 DIAGNOSIS — F411 Generalized anxiety disorder: Secondary | ICD-10-CM | POA: Diagnosis not present

## 2017-08-21 DIAGNOSIS — F411 Generalized anxiety disorder: Secondary | ICD-10-CM | POA: Diagnosis not present

## 2017-08-25 DIAGNOSIS — F411 Generalized anxiety disorder: Secondary | ICD-10-CM | POA: Diagnosis not present

## 2017-08-26 DIAGNOSIS — H903 Sensorineural hearing loss, bilateral: Secondary | ICD-10-CM | POA: Diagnosis not present

## 2017-08-26 DIAGNOSIS — H8103 Meniere's disease, bilateral: Secondary | ICD-10-CM | POA: Diagnosis not present

## 2017-08-28 DIAGNOSIS — F1210 Cannabis abuse, uncomplicated: Secondary | ICD-10-CM | POA: Diagnosis not present

## 2017-08-28 DIAGNOSIS — F4010 Social phobia, unspecified: Secondary | ICD-10-CM | POA: Diagnosis not present

## 2017-08-28 DIAGNOSIS — F329 Major depressive disorder, single episode, unspecified: Secondary | ICD-10-CM | POA: Diagnosis not present

## 2017-09-11 DIAGNOSIS — F3181 Bipolar II disorder: Secondary | ICD-10-CM | POA: Diagnosis not present

## 2017-09-14 DIAGNOSIS — F411 Generalized anxiety disorder: Secondary | ICD-10-CM | POA: Diagnosis not present

## 2017-09-18 DIAGNOSIS — F3181 Bipolar II disorder: Secondary | ICD-10-CM | POA: Diagnosis not present

## 2017-09-21 DIAGNOSIS — F1210 Cannabis abuse, uncomplicated: Secondary | ICD-10-CM | POA: Diagnosis not present

## 2017-09-21 DIAGNOSIS — F4010 Social phobia, unspecified: Secondary | ICD-10-CM | POA: Diagnosis not present

## 2017-09-21 DIAGNOSIS — F329 Major depressive disorder, single episode, unspecified: Secondary | ICD-10-CM | POA: Diagnosis not present

## 2017-09-22 DIAGNOSIS — F411 Generalized anxiety disorder: Secondary | ICD-10-CM | POA: Diagnosis not present

## 2017-09-29 DIAGNOSIS — F411 Generalized anxiety disorder: Secondary | ICD-10-CM | POA: Diagnosis not present

## 2017-10-02 DIAGNOSIS — F3181 Bipolar II disorder: Secondary | ICD-10-CM | POA: Diagnosis not present

## 2017-10-05 DIAGNOSIS — F411 Generalized anxiety disorder: Secondary | ICD-10-CM | POA: Diagnosis not present

## 2017-10-16 DIAGNOSIS — F3181 Bipolar II disorder: Secondary | ICD-10-CM | POA: Diagnosis not present

## 2017-10-23 DIAGNOSIS — M654 Radial styloid tenosynovitis [de Quervain]: Secondary | ICD-10-CM | POA: Insufficient documentation

## 2017-10-30 DIAGNOSIS — F411 Generalized anxiety disorder: Secondary | ICD-10-CM | POA: Diagnosis not present

## 2017-11-02 DIAGNOSIS — F329 Major depressive disorder, single episode, unspecified: Secondary | ICD-10-CM | POA: Diagnosis not present

## 2017-11-02 DIAGNOSIS — F1210 Cannabis abuse, uncomplicated: Secondary | ICD-10-CM | POA: Diagnosis not present

## 2017-11-02 DIAGNOSIS — F4010 Social phobia, unspecified: Secondary | ICD-10-CM | POA: Diagnosis not present

## 2017-11-05 DIAGNOSIS — M654 Radial styloid tenosynovitis [de Quervain]: Secondary | ICD-10-CM | POA: Diagnosis not present

## 2017-11-13 DIAGNOSIS — M654 Radial styloid tenosynovitis [de Quervain]: Secondary | ICD-10-CM | POA: Diagnosis not present

## 2017-11-16 DIAGNOSIS — F411 Generalized anxiety disorder: Secondary | ICD-10-CM | POA: Diagnosis not present

## 2017-11-19 DIAGNOSIS — F3181 Bipolar II disorder: Secondary | ICD-10-CM | POA: Diagnosis not present

## 2017-11-20 DIAGNOSIS — M654 Radial styloid tenosynovitis [de Quervain]: Secondary | ICD-10-CM | POA: Diagnosis not present

## 2017-11-24 DIAGNOSIS — F411 Generalized anxiety disorder: Secondary | ICD-10-CM | POA: Diagnosis not present

## 2017-11-27 DIAGNOSIS — M654 Radial styloid tenosynovitis [de Quervain]: Secondary | ICD-10-CM | POA: Diagnosis not present

## 2017-12-08 DIAGNOSIS — F411 Generalized anxiety disorder: Secondary | ICD-10-CM | POA: Diagnosis not present

## 2017-12-28 DIAGNOSIS — M654 Radial styloid tenosynovitis [de Quervain]: Secondary | ICD-10-CM | POA: Diagnosis not present

## 2018-01-22 DIAGNOSIS — F3181 Bipolar II disorder: Secondary | ICD-10-CM | POA: Diagnosis not present

## 2018-03-08 DIAGNOSIS — F3181 Bipolar II disorder: Secondary | ICD-10-CM | POA: Diagnosis not present

## 2018-03-29 DIAGNOSIS — F3181 Bipolar II disorder: Secondary | ICD-10-CM | POA: Diagnosis not present

## 2018-04-12 DIAGNOSIS — F3181 Bipolar II disorder: Secondary | ICD-10-CM | POA: Diagnosis not present

## 2019-03-08 ENCOUNTER — Ambulatory Visit: Payer: Self-pay | Admitting: *Deleted

## 2019-03-08 NOTE — Telephone Encounter (Signed)
Patient is calling to request testing. 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 Several golfers have tested positive for COVID- all have been asymptomatic. Patient is concerned about his exposure and being around his family members. Patient needs to reestablish care and needs virtual visit for testing order. Patient is aware the office is closed and he will be contacted in the morning.  Reason for Disposition . [1] COVID-19 EXPOSURE (Close Contact) AND [2] within last 14 days BUT [3] NO symptoms  Answer Assessment - Initial Assessment Questions 1. 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 PLACE of CONTACT: "Where were you when you were exposed to COVID-19?" (e.g., home, school, medical waiting room; which city?)     Golf- roommate 3. TYPE of CONTACT: "How much contact was there?" (e.g., sitting next to, live in same house, work in same office, same building)     Play golf/room/ride 4. DURATION of CONTACT: "How long were you in contact with the COVID-19 patient?" (e.g., a few seconds, passed by person, a few minutes, live with the patient)     Constant contact for 5 days- no one that has tested positive has symptoms 5. DATE of CONTACT: "When did you have contact with a COVID-19 patient?" (e.g., how many days ago)     Today- last Monday 6. TRAVEL: "Have you traveled out of the country recently?" If so, "When and where?"     * Also ask about out-of-state travel, since the CDC has identified some high-risk cities for community spread in the [place].     * Note: Travel becomes less relevant if there is widespread community transmission where the patient lives.     [place] since Friday 7. COMMUNITY SPREAD: "Are there lots of cases of COVID-19 (community spread) where you live?" (See public health  department website, if unsure)       minor 8. SYMPTOMS: "Do you have any symptoms?" (e.g., fever, cough, breathing difficulty)     No symptoms 9. PREGNANCY OR POSTPARTUM: "Is there any chance you are pregnant?" "When was your last menstrual period?" "Did you deliver in the last 2 weeks?"     n/a 10. HIGH RISK: "Do you have any heart or lung problems? Do you have a weak immune system?" (e.g., CHF, COPD, asthma, HIV positive, chemotherapy, renal failure, diabetes mellitus, sickle cell anemia)       no  Protocols used: CORONAVIRUS (COVID-19) EXPOSURE-A-AH

## 2019-03-08 NOTE — Telephone Encounter (Signed)
Needs virtual visit please.  

## 2019-03-09 ENCOUNTER — Other Ambulatory Visit: Payer: Self-pay

## 2019-03-09 ENCOUNTER — Ambulatory Visit (INDEPENDENT_AMBULATORY_CARE_PROVIDER_SITE_OTHER): Payer: 59 | Admitting: Internal Medicine

## 2019-03-09 ENCOUNTER — Encounter: Payer: Self-pay | Admitting: Internal Medicine

## 2019-03-09 ENCOUNTER — Telehealth: Payer: Self-pay | Admitting: *Deleted

## 2019-03-09 DIAGNOSIS — Z20828 Contact with and (suspected) exposure to other viral communicable diseases: Secondary | ICD-10-CM

## 2019-03-09 DIAGNOSIS — Z20822 Contact with and (suspected) exposure to covid-19: Secondary | ICD-10-CM

## 2019-03-09 NOTE — Progress Notes (Signed)
Subjective:    Patient ID: Devin Chang, male    DOB: [birth year], 28 y.o.   MRN: [id number]  DOS:  03/09/2019 Type of visit - description:  Virtual Visit via Video Note  I connected with@ on 03/11/19 at 10:20 AM EDT by a video enabled telemedicine application and verified that I am speaking with the correct person using two identifiers.   THIS ENCOUNTER IS A VIRTUAL VISIT DUE TO COVID-19 - PATIENT WAS NOT SEEN IN THE OFFICE. PATIENT HAS CONSENTED TO VIRTUAL VISIT / TELEMEDICINE VISIT   Location of patient: home  Location of provider: office  I discussed the limitations of evaluation and management by telemedicine and the availability of in person appointments. The patient expressed understanding and agreed to proceed.  History of Present Illness: New patient, last visit 2016 Acute visit The patient has been exposed to somebody with potential COVID-19.  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 He requests COVID-19 testing.  Review of Systems  Denies fever chills No nausea or vomiting No lack of the smell or taste No cough No chest pain no difficulty breathing Past Medical History:  Diagnosis Date  . Anxiety   . Depression   . Hearing loss 2018   left ear, ENT recommend hearing aid  . Meniere disease, bilateral   . Panic attacks   . Right knee injury 2012   small chondral injury, right arthroscopy in 01/2012    Past Surgical History:  Procedure Laterality Date  . KNEE ARTHROSCOPY W/ DEBRIDEMENT Right 02/03/4131   w/ Plica release, at [facility] in [place], [place]  . TONSILLECTOMY  2018  . WISDOM TOOTH EXTRACTION  2012   Dr. Loyal Gambler [place]    Social History   Socioeconomic History  . Marital status: Single    Spouse name: Not on file  . Number of children: 0  . Years of education: 51  . Highest education level: Not on file  Occupational History  . Occupation: graduated  [employer].   . Occupation: golf profesional    Social Needs  . Financial resource strain: Not on file  . Food insecurity    Worry: Not on file    Inability: Not on file  . Transportation needs    Medical: Not on file    Non-medical: Not on file  Tobacco Use  . Smoking status: Former Research scientist (life sciences)  . Smokeless tobacco: Former Systems developer  . Tobacco comment: no tobacco at present   Substance and Sexual Activity  . Alcohol use: Yes    Alcohol/week: 0.0 standard drinks    Comment: sporadic etoh  . Drug use: Yes    Types: Marijuana    Comment: Marijuana, daily-heavy, denies other subsatances including pain killers   . Sexual activity: Not on file  Lifestyle  . Physical activity    Days per week: Not on file    Minutes per session: Not on file  . Stress: Not on file  Relationships  . Social Herbalist on phone: Not on file    Gets together: Not on file    Attends religious service: Not on file    Active member of club or organization: Not on file    Attends meetings of clubs or organizations: Not on file    Relationship status: Not on file  . Intimate partner violence    Fear of current or ex partner: Not on file    Emotionally abused: Not on  file    Physically abused: Not on file    Forced sexual activity: Not on file  Other Topics Concern  . Not on file  Social History Narrative   [place]     Family History  Problem Relation Age of Onset  . Arthritis Sister   . Depression Sister   . Colon cancer Maternal Uncle        colon cancer  . Hypertension Maternal Grandmother   . Hyperlipidemia Maternal Grandmother   . Dementia Paternal Grandmother   . Cancer Paternal Grandfather        esophageal cancer  . Suicidality Neg Hx      Allergies as of 03/09/2019   No Known Allergies     Medication List       Accurate as of March 09, 2019 11:59 PM. If you have any questions, ask your nurse or doctor.        STOP taking these medications   amphetamine-dextroamphetamine 20 MG tablet Commonly known as:  ADDERALL Stopped by: Willow OraJose Avrie Kedzierski, MD   clarithromycin 500 MG 24 hr tablet Commonly known as: BIAXIN XL Stopped by: Willow OraJose Sanders Manninen, MD   escitalopram 20 MG tablet Commonly known as: LEXAPRO Stopped by: Willow OraJose Harlie Ragle, MD   fluticasone 50 MCG/ACT nasal spray Commonly known as: FLONASE Stopped by: Willow OraJose Norris Bodley, MD   lidocaine 2 % solution Commonly known as: XYLOCAINE Stopped by: Willow OraJose Siddiq Kaluzny, MD   triamterene-hydrochlorothiazide 37.5-25 MG capsule Commonly known as: DYAZIDE Stopped by: Willow OraJose Denetta Fei, MD           Objective:   Physical Exam There were no vitals taken for this visit. 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 He is asx.  Last exposure to a potential COVID-19 patient was yesterday. He needs to consider himself contagious for the next 5 days if the test is negative and he remains asymptomatic If the test is positive or if he develops symptoms, he will be contagious for the next 2 weeks. Regardless of the test results, I recommend him very good precautions in the following months.

## 2019-03-09 NOTE — Telephone Encounter (Signed)
-----   Message from Colon Branch, MD sent at 03/09/2019 10:56 AM EDT ----- Regarding: Needs COVID-19 testing

## 2019-03-09 NOTE — Telephone Encounter (Signed)
Spoke with patient, scheduled him for COVID 19 testing today at 1pm at [facility].  Testing protocol reviewed.

## 2019-03-11 ENCOUNTER — Encounter: Payer: Self-pay | Admitting: Internal Medicine

## 2019-03-11 NOTE — Assessment & Plan Note (Signed)
COVID-19 exposure: We will send the patient to be tested. He is asx.  Last exposure to a potential COVID-19 patient was yesterday. He needs to consider himself contagious for the next 5 days if the test is negative and he remains asymptomatic If the test is positive or if he develops symptoms, he will be contagious for the next 2 weeks. Regardless of the test results, I recommend him very good precautions in the following months.

## 2019-03-14 LAB — NOVEL CORONAVIRUS, NAA: SARS-CoV-2, NAA: NOT DETECTED

## 2019-05-19 ENCOUNTER — Other Ambulatory Visit: Payer: Self-pay

## 2019-05-19 DIAGNOSIS — Z20822 Contact with and (suspected) exposure to covid-19: Secondary | ICD-10-CM

## 2019-05-20 LAB — NOVEL CORONAVIRUS, NAA: SARS-CoV-2, NAA: NOT DETECTED

## 2019-05-23 DIAGNOSIS — M7670 Peroneal tendinitis, unspecified leg: Secondary | ICD-10-CM | POA: Insufficient documentation

## 2019-08-03 ENCOUNTER — Other Ambulatory Visit: Payer: Self-pay

## 2019-08-03 DIAGNOSIS — Z20822 Contact with and (suspected) exposure to covid-19: Secondary | ICD-10-CM

## 2019-08-05 LAB — NOVEL CORONAVIRUS, NAA: SARS-CoV-2, NAA: NOT DETECTED

## 2019-08-10 ENCOUNTER — Other Ambulatory Visit: Payer: Self-pay

## 2019-08-10 DIAGNOSIS — Z20822 Contact with and (suspected) exposure to covid-19: Secondary | ICD-10-CM

## 2019-08-12 LAB — NOVEL CORONAVIRUS, NAA: SARS-CoV-2, NAA: NOT DETECTED

## 2019-09-19 DIAGNOSIS — M9902 Segmental and somatic dysfunction of thoracic region: Secondary | ICD-10-CM | POA: Diagnosis not present

## 2019-09-19 DIAGNOSIS — M9903 Segmental and somatic dysfunction of lumbar region: Secondary | ICD-10-CM | POA: Diagnosis not present

## 2019-09-19 DIAGNOSIS — M9905 Segmental and somatic dysfunction of pelvic region: Secondary | ICD-10-CM | POA: Diagnosis not present

## 2019-09-19 DIAGNOSIS — M9901 Segmental and somatic dysfunction of cervical region: Secondary | ICD-10-CM | POA: Diagnosis not present

## 2019-09-23 DIAGNOSIS — M9902 Segmental and somatic dysfunction of thoracic region: Secondary | ICD-10-CM | POA: Diagnosis not present

## 2019-09-23 DIAGNOSIS — M9905 Segmental and somatic dysfunction of pelvic region: Secondary | ICD-10-CM | POA: Diagnosis not present

## 2019-09-23 DIAGNOSIS — M9901 Segmental and somatic dysfunction of cervical region: Secondary | ICD-10-CM | POA: Diagnosis not present

## 2019-09-23 DIAGNOSIS — M9903 Segmental and somatic dysfunction of lumbar region: Secondary | ICD-10-CM | POA: Diagnosis not present

## 2019-09-29 DIAGNOSIS — M9905 Segmental and somatic dysfunction of pelvic region: Secondary | ICD-10-CM | POA: Diagnosis not present

## 2019-09-29 DIAGNOSIS — M9903 Segmental and somatic dysfunction of lumbar region: Secondary | ICD-10-CM | POA: Diagnosis not present

## 2019-09-29 DIAGNOSIS — M9901 Segmental and somatic dysfunction of cervical region: Secondary | ICD-10-CM | POA: Diagnosis not present

## 2019-09-29 DIAGNOSIS — M9902 Segmental and somatic dysfunction of thoracic region: Secondary | ICD-10-CM | POA: Diagnosis not present

## 2019-10-18 DIAGNOSIS — M9902 Segmental and somatic dysfunction of thoracic region: Secondary | ICD-10-CM | POA: Diagnosis not present

## 2019-10-18 DIAGNOSIS — M9901 Segmental and somatic dysfunction of cervical region: Secondary | ICD-10-CM | POA: Diagnosis not present

## 2019-10-18 DIAGNOSIS — M9903 Segmental and somatic dysfunction of lumbar region: Secondary | ICD-10-CM | POA: Diagnosis not present

## 2019-10-18 DIAGNOSIS — M9905 Segmental and somatic dysfunction of pelvic region: Secondary | ICD-10-CM | POA: Diagnosis not present

## 2019-11-07 ENCOUNTER — Ambulatory Visit: Payer: BLUE CROSS/BLUE SHIELD | Attending: Internal Medicine

## 2019-11-07 DIAGNOSIS — R52 Pain, unspecified: Secondary | ICD-10-CM | POA: Diagnosis not present

## 2019-11-07 DIAGNOSIS — Z9189 Other specified personal risk factors, not elsewhere classified: Secondary | ICD-10-CM | POA: Diagnosis not present

## 2019-11-07 DIAGNOSIS — J029 Acute pharyngitis, unspecified: Secondary | ICD-10-CM | POA: Diagnosis not present

## 2019-11-07 DIAGNOSIS — Z20822 Contact with and (suspected) exposure to covid-19: Secondary | ICD-10-CM

## 2019-11-07 DIAGNOSIS — R509 Fever, unspecified: Secondary | ICD-10-CM | POA: Diagnosis not present

## 2019-11-08 LAB — NOVEL CORONAVIRUS, NAA: SARS-CoV-2, NAA: NOT DETECTED

## 2019-11-09 DIAGNOSIS — R0981 Nasal congestion: Secondary | ICD-10-CM | POA: Diagnosis not present

## 2019-11-09 DIAGNOSIS — R519 Headache, unspecified: Secondary | ICD-10-CM | POA: Diagnosis not present

## 2019-11-09 DIAGNOSIS — Z20822 Contact with and (suspected) exposure to covid-19: Secondary | ICD-10-CM | POA: Diagnosis not present

## 2019-11-09 DIAGNOSIS — R5383 Other fatigue: Secondary | ICD-10-CM | POA: Diagnosis not present

## 2019-11-09 DIAGNOSIS — J029 Acute pharyngitis, unspecified: Secondary | ICD-10-CM | POA: Diagnosis not present

## 2019-11-12 DIAGNOSIS — Z20822 Contact with and (suspected) exposure to covid-19: Secondary | ICD-10-CM | POA: Diagnosis not present

## 2019-11-12 DIAGNOSIS — J029 Acute pharyngitis, unspecified: Secondary | ICD-10-CM | POA: Diagnosis not present

## 2019-11-14 ENCOUNTER — Other Ambulatory Visit: Payer: BLUE CROSS/BLUE SHIELD

## 2019-11-21 DIAGNOSIS — Z20822 Contact with and (suspected) exposure to covid-19: Secondary | ICD-10-CM | POA: Diagnosis not present

## 2019-11-21 DIAGNOSIS — J029 Acute pharyngitis, unspecified: Secondary | ICD-10-CM | POA: Diagnosis not present

## 2019-11-30 ENCOUNTER — Telehealth: Payer: Self-pay | Admitting: Internal Medicine

## 2019-11-30 DIAGNOSIS — J0100 Acute maxillary sinusitis, unspecified: Secondary | ICD-10-CM | POA: Diagnosis not present

## 2019-11-30 DIAGNOSIS — J312 Chronic pharyngitis: Secondary | ICD-10-CM

## 2019-11-30 DIAGNOSIS — J029 Acute pharyngitis, unspecified: Secondary | ICD-10-CM | POA: Diagnosis not present

## 2019-11-30 NOTE — Telephone Encounter (Signed)
Referral placed.

## 2019-11-30 NOTE — Telephone Encounter (Signed)
Patient is requesting a referral to ENT due to Sore Throat for Weeks.   [facility] [address], [place], [place] [address]  ~1.5 mi [phone]

## 2019-12-06 DIAGNOSIS — J343 Hypertrophy of nasal turbinates: Secondary | ICD-10-CM | POA: Diagnosis not present

## 2019-12-06 DIAGNOSIS — Z9089 Acquired absence of other organs: Secondary | ICD-10-CM | POA: Diagnosis not present

## 2019-12-06 DIAGNOSIS — J029 Acute pharyngitis, unspecified: Secondary | ICD-10-CM | POA: Diagnosis not present

## 2020-01-06 ENCOUNTER — Ambulatory Visit: Payer: BLUE CROSS/BLUE SHIELD | Attending: Internal Medicine

## 2020-01-06 DIAGNOSIS — Z23 Encounter for immunization: Secondary | ICD-10-CM

## 2020-01-06 NOTE — Progress Notes (Signed)
   Covid-19 Vaccination Clinic  Name:  Devin Chang    MRN: [id number] DOB: 03-Dec-1990  01/06/2020  Mr. Dollins was observed post Covid-19 immunization for 15 minutes without incident. He was provided with Vaccine Information Sheet and instruction to access the V-Safe system.   Mr. Denardo was instructed to call 911 with any severe reactions post vaccine: Marland Kitchen Difficulty breathing  . Swelling of face and throat  . A fast heartbeat  . A bad rash all over body  . Dizziness and weakness   Immunizations Administered    Name Date Dose VIS Date Route   Pfizer COVID-19 Vaccine 01/06/2020  1:48 PM 0.3 mL 11/02/2018 Intramuscular   Manufacturer: [employer], [employer]   Lot: [id number]   NDC: [address]-9

## 2020-01-28 ENCOUNTER — Ambulatory Visit: Payer: BLUE CROSS/BLUE SHIELD | Attending: Internal Medicine

## 2020-01-28 DIAGNOSIS — Z23 Encounter for immunization: Secondary | ICD-10-CM

## 2020-01-28 NOTE — Progress Notes (Signed)
   Covid-19 Vaccination Clinic  Name:  Devin Chang    MRN: [id number] DOB: 03/24/1991  01/28/2020  Mr. [name] was observed post Covid-19 immunization for 15 minutes without incident. He was provided with Vaccine Information Sheet and instruction to access the V-Safe system.   Mr. Desire was instructed to call 911 with any severe reactions post vaccine: Marland Kitchen Difficulty breathing  . Swelling of face and throat  . A fast heartbeat  . A bad rash all over body  . Dizziness and weakness   Immunizations Administered    Name Date Dose VIS Date Route   Pfizer COVID-19 Vaccine 01/28/2020 10:52 AM 0.3 mL 11/02/2018 Intramuscular   Manufacturer: [employer], [employer]   Lot: [id number]   NDC: [address]-1

## 2020-01-30 ENCOUNTER — Ambulatory Visit: Payer: BLUE CROSS/BLUE SHIELD

## 2020-07-16 ENCOUNTER — Other Ambulatory Visit: Payer: Self-pay

## 2020-07-16 ENCOUNTER — Encounter: Payer: Self-pay | Admitting: Internal Medicine

## 2020-07-16 ENCOUNTER — Ambulatory Visit: Payer: BLUE CROSS/BLUE SHIELD | Admitting: Internal Medicine

## 2020-07-16 VITALS — BP 109/74 | HR 87 | Temp 97.7°F | Resp 16 | Ht 72.0 in | Wt 183.2 lb

## 2020-07-16 DIAGNOSIS — J309 Allergic rhinitis, unspecified: Secondary | ICD-10-CM

## 2020-07-16 DIAGNOSIS — K219 Gastro-esophageal reflux disease without esophagitis: Secondary | ICD-10-CM | POA: Diagnosis not present

## 2020-07-16 MED ORDER — PANTOPRAZOLE SODIUM 40 MG PO TBEC: 40.0000 mg | DELAYED_RELEASE_TABLET | Freq: Every day | ORAL | 6 refills | Status: DC

## 2020-07-16 MED ORDER — CROMOLYN SODIUM 4 % OP SOLN
1.0000 [drp] | Freq: Four times a day (QID) | OPHTHALMIC | 3 refills | Status: DC
Start: 2020-07-16 — End: 2023-07-01

## 2020-07-16 NOTE — Progress Notes (Signed)
Pre visit review using our clinic review tool, if applicable. No additional management support is needed unless otherwise documented below in the visit note. 

## 2020-07-16 NOTE — Progress Notes (Signed)
Subjective:    Patient ID: Devin Chang, male    DOB: 11-11-1990, 29 y.o.   MRN: [id number]  DOS:  07/16/2020 Type of visit - description: Here with multiple issues  2 weeks ago had 3 episode of nosebleed, side? Since then no further episodes but has noticed nasal mucus associated with some fresh blood from time to time.  2 weeks ago started with severe itchy eyes, + crust in the corner of the eyes in the mornings. Sometimes the eyes getting slightly red.  He uses contact lenses.  Also long history of hiccups on and off, he thinks is "more than most people would have ". He has also associated acid reflux, on and off.    Review of Systems Denies fever chills.  No weight loss Admits to postnasal dripping and sneezing. No dysphagia, odynophagia.  No blood in the stools No cough sputum production or wheezing.  Past Medical History:  Diagnosis Date  . Anxiety   . Depression   . Hearing loss 2018   left ear, ENT recommend hearing aid  . Meniere disease, bilateral   . Panic attacks   . Right knee injury 2012   small chondral injury, right arthroscopy in 01/2012    Past Surgical History:  Procedure Laterality Date  . KNEE ARTHROSCOPY W/ DEBRIDEMENT Right 01/16/2012   w/ Plica release, at [facility] in [place], [place]  . TONSILLECTOMY  2018  . WISDOM TOOTH EXTRACTION  2012   Dr. Retta Mac [place]    Allergies as of 07/16/2020   No Known Allergies     Medication List    as of July 16, 2020  4:06 PM   You have not been prescribed any medications.        Objective:   Physical Exam BP 109/74 (BP Location: Left Arm, Patient Position: Sitting, Cuff Size: Normal)   Pulse 87   Temp 97.7 F (36.5 C) (Oral)   Resp 16   Ht 6' (1.829 m)   Wt 183 lb 4 oz (83.1 kg)   SpO2 96%   BMI 24.85 kg/m  General:   Well developed, NAD, BMI noted. HEENT:  Normocephalic . Face symmetric, atraumatic TMs: Bulged, more on the right.  No redness. Throat: Symmetric, no white  patches. Nose: Turbinates on the right seems enlarged, some dried mucus and blood noted.  Turbinates on the left seems normal Neck: No unusual lymph nodes. Lungs:  CTA B Normal respiratory effort, no intercostal retractions, no accessory muscle use. Heart: RRR,  no murmur.  Lower extremities: no pretibial edema bilaterally  Skin: Not pale. Not jaundice Neurologic:  alert & oriented X3.  Speech normal, gait appropriate for age and unassisted Psych--  Cognition and judgment appear intact.  Cooperative with normal attention span and concentration.  Behavior appropriate. No anxious or depressed appearing.      Assessment       Assessment Anxiety depression ADD HOH L ear, dx age 61 (sudden deafness syndrome, saw Dr Jac Canavan, now otosclerosis )  Plan: Allergic conjunctivitis and sinusitis. Recommend Flonase, Zyrtec, cromolyn eyedrops for 2 weeks (no eye contact for 2 weeks). If not better he will let me know, abx? GERD: Start pantoprazole every morning on empty stomach, diet precautions discussed, anticipate hiccups should get better, if they do not he is to let me know. After 6 weeks of PPIs he can attempt to decrease the dose to every other day. RTC 4 months CPX    This visit occurred during the SARS-CoV-2  public health emergency.  Safety protocols were in place, including screening questions prior to the visit, additional usage of staff PPE, and extensive cleaning of exam room while observing appropriate contact time as indicated for disinfecting solutions.

## 2020-07-16 NOTE — Patient Instructions (Addendum)
  Pantoprazole 40 mg: 1 tablet before breakfast daily. After 6 weeks you can try to wean off to 1 tablet every other day  If the hiccups are not better let me know  Allergies: Zyrtec over-the-counter daily Flonase 2 sprays on each side of the nose daily Use the eyedrops as recommended for 2 weeks, then you can stop.  No eye contact while using the eyedrops. Call if the allergies and nosebleeds are not better.    GO TO THE FRONT DESK, PLEASE SCHEDULE YOUR APPOINTMENTS Come back for physical exam in 4 months

## 2020-07-17 NOTE — Assessment & Plan Note (Signed)
Allergic conjunctivitis and sinusitis. Recommend Flonase, Zyrtec, cromolyn eyedrops for 2 weeks (no eye contact for 2 weeks). If not better he will let me know, abx? GERD: Start pantoprazole every morning on empty stomach, diet precautions discussed, anticipate hiccups should get better, if they do not he is to let me know. After 6 weeks of PPIs he can attempt to decrease the dose to every other day. RTC 4 months CPX

## 2020-09-18 DIAGNOSIS — Z713 Dietary counseling and surveillance: Secondary | ICD-10-CM | POA: Diagnosis not present

## 2020-10-02 DIAGNOSIS — Z713 Dietary counseling and surveillance: Secondary | ICD-10-CM | POA: Diagnosis not present

## 2020-10-11 ENCOUNTER — Ambulatory Visit (INDEPENDENT_AMBULATORY_CARE_PROVIDER_SITE_OTHER): Payer: BC Managed Care – PPO | Admitting: Internal Medicine

## 2020-10-11 ENCOUNTER — Encounter: Payer: Self-pay | Admitting: Internal Medicine

## 2020-10-11 ENCOUNTER — Other Ambulatory Visit: Payer: Self-pay

## 2020-10-11 VITALS — BP 112/68 | HR 68 | Temp 98.0°F | Resp 16 | Ht 72.0 in | Wt 198.2 lb

## 2020-10-11 DIAGNOSIS — Z114 Encounter for screening for human immunodeficiency virus [HIV]: Secondary | ICD-10-CM | POA: Diagnosis not present

## 2020-10-11 DIAGNOSIS — Z1159 Encounter for screening for other viral diseases: Secondary | ICD-10-CM

## 2020-10-11 DIAGNOSIS — Z0000 Encounter for general adult medical examination without abnormal findings: Secondary | ICD-10-CM | POA: Diagnosis not present

## 2020-10-11 DIAGNOSIS — Z23 Encounter for immunization: Secondary | ICD-10-CM | POA: Diagnosis not present

## 2020-10-11 LAB — COMPREHENSIVE METABOLIC PANEL
ALT: 31 U/L (ref 0–53)
AST: 22 U/L (ref 0–37)
Albumin: 4.6 g/dL (ref 3.5–5.2)
Alkaline Phosphatase: 65 U/L (ref 39–117)
BUN: 22 mg/dL (ref 6–23)
CO2: 31 mEq/L (ref 19–32)
Calcium: 10 mg/dL (ref 8.4–10.5)
Chloride: 103 mEq/L (ref 96–112)
Creatinine, Ser: 1.07 mg/dL (ref 0.40–1.50)
GFR: 94.46 mL/min (ref >60.00)
Glucose, Bld: 73 mg/dL (ref 70–99)
Potassium: 5 mEq/L (ref 3.5–5.1)
Sodium: 138 mEq/L (ref 135–145)
Total Bilirubin: 0.6 mg/dL (ref 0.2–1.2)
Total Protein: 7.1 g/dL (ref 6.0–8.3)

## 2020-10-11 LAB — CBC WITH DIFFERENTIAL/PLATELET
Basophils Absolute: 0 10*3/uL (ref 0.0–0.1)
Basophils Relative: 0.3 % (ref 0.0–3.0)
Eosinophils Absolute: 0.2 10*3/uL (ref 0.0–0.7)
Eosinophils Relative: 2.4 % (ref 0.0–5.0)
HCT: 43.3 % (ref 39.0–52.0)
Hemoglobin: 14.5 g/dL (ref 13.0–17.0)
Lymphocytes Relative: 21.1 % (ref 12.0–46.0)
Lymphs Abs: 1.4 10*3/uL (ref 0.7–4.0)
MCHC: 33.5 g/dL (ref 30.0–36.0)
MCV: 91.5 fl (ref 78.0–100.0)
Monocytes Absolute: 0.7 10*3/uL (ref 0.1–1.0)
Monocytes Relative: 9.9 % (ref 3.0–12.0)
Neutro Abs: 4.4 10*3/uL (ref 1.4–7.7)
Neutrophils Relative %: 66.3 % (ref 43.0–77.0)
Platelets: 293 10*3/uL (ref 150.0–400.0)
RBC: 4.73 Mil/uL (ref 4.22–5.81)
RDW: 13.3 % (ref 11.5–15.5)
WBC: 6.7 10*3/uL (ref 4.0–10.5)

## 2020-10-11 LAB — LIPID PANEL
Cholesterol: 157 mg/dL (ref 0–200)
HDL: 41.5 mg/dL (ref >39.00)
LDL Cholesterol: 89 mg/dL (ref 0–99)
NonHDL: 115.95
Total CHOL/HDL Ratio: 4
Triglycerides: 135 mg/dL (ref 0.0–149.0)
VLDL: 27 mg/dL (ref 0.0–40.0)

## 2020-10-11 LAB — TSH: TSH: 1.36 u[IU]/mL (ref 0.35–4.50)

## 2020-10-11 NOTE — Progress Notes (Signed)
   Subjective:    Patient ID: Devin Chang, male    DOB: [birth year], 30 y.o.   MRN: [id number]  DOS:  10/11/2020 Type of visit - description: CPX  Doing well, no major concerns.  Review of Systems   A 14 point review of systems is negative    Past Medical History:  Diagnosis Date  . Anxiety   . Depression   . Hearing loss 2018   left ear, ENT recommend hearing aid  . Meniere disease, bilateral   . Panic attacks   . Right knee injury 2012   small chondral injury, right arthroscopy in 01/2012    Past Surgical History:  Procedure Laterality Date  . KNEE ARTHROSCOPY W/ DEBRIDEMENT Right 01/16/2012   w/ Plica release, at [facility] in [place], [place]  . TONSILLECTOMY  2018  . WISDOM TOOTH EXTRACTION  2012   Dr. Retta Mac [place]    Allergies as of 10/11/2020   No Known Allergies     Medication List       Accurate as of October 11, 2020 11:59 PM. If you have any questions, ask your nurse or doctor.        STOP taking these medications   pantoprazole 40 MG tablet Commonly known as: Protonix Stopped by: Willow Ora, MD     TAKE these medications   cromolyn 4 % ophthalmic solution Commonly known as: OPTICROM Place 1 drop into both eyes 4 (four) times daily.          Objective:   Physical Exam BP 112/68 (BP Location: Left Arm, Patient Position: Sitting, Cuff Size: Normal)   Pulse 68   Temp 98 F (36.7 C) (Oral)   Resp 16   Ht 6' (1.829 m)   Wt 198 lb 4 oz (89.9 kg)   SpO2 98%   BMI 26.89 kg/m  General: Well developed, NAD, BMI noted Neck: No  thyromegaly  HEENT:  Normocephalic . Face symmetric, atraumatic Lungs:  CTA B Normal respiratory effort, no intercostal retractions, no accessory muscle use. Heart: RRR,  no murmur.  Abdomen:  Not distended, soft, non-tender. No rebound or rigidity.   Lower extremities: no pretibial edema bilaterally  Skin: Exposed areas without rash. Not pale. Not jaundice Neurologic:  alert & oriented X3.  Speech normal, gait  appropriate for age and unassisted Strength symmetric and appropriate for age.  Psych: Cognition and judgment appear intact.  Cooperative with normal attention span and concentration.  Behavior appropriate. No anxious or depressed appearing.     Assessment    ASSESSMENT  Anxiety depression ADD HOH L ear, dx age 36 (sudden deafness syndrome, saw Dr Jac Canavan, now otosclerosis ); typically doesn't use a hearing aid   Plan: Here for CPX Anxiety depression: Currently on no medications, has weekly counseling.  Seems to be doing well. ADD: Reports is not an issue at this time RTC 1 year  This visit occurred during the SARS-CoV-2 public health emergency.  Safety protocols were in place, including screening questions prior to the visit, additional usage of staff PPE, and extensive cleaning of exam room while observing appropriate contact time as indicated for disinfecting solutions.

## 2020-10-11 NOTE — Progress Notes (Signed)
Pre visit review using our clinic review tool, if applicable. No additional management support is needed unless otherwise documented below in the visit note. 

## 2020-10-11 NOTE — Patient Instructions (Addendum)
GO TO THE LAB : Get the blood work     GO TO THE FRONT DESK, PLEASE SCHEDULE YOUR APPOINTMENTS Come back for a physical in 1 year   Safe Sex Practicing safe sex means taking steps before and during sex to reduce your risk of:  Getting an STI (sexually transmitted infection).  Giving your partner an STI.  Unwanted or unplanned pregnancy. How to practice safe sex Ways you can practice safe sex  Limit your sexual partners to only one partner who is having sex with only you.  Avoid using alcohol and drugs before having sex. Alcohol and drugs can affect your judgment.  Before having sex with a new partner: ? Talk to your partner about past partners, past STIs, and drug use. ? Get screened for STIs and discuss the results with your partner. Ask your partner to get screened too.  Check your body regularly for sores, blisters, rashes, or unusual discharge. If you notice any of these problems, visit your health care provider.  Avoid sexual contact if you have symptoms of an infection or you are being treated for an STI.  While having sex, use a condom. Make sure to: ? Use a condom every time you have vaginal, oral, or anal sex. Both females and males should wear condoms during oral sex. ? Keep condoms in place from the beginning to the end of sexual activity. ? Use a latex condom, if possible. Latex condoms offer the best protection. ? Use only water-based lubricants with a condom. Using petroleum-based lubricants or oils will weaken the condom and increase the chance that it will break.   Ways your health care provider can help you practice safe sex  See your health care provider for regular screenings, exams, and tests for STIs.  Talk with your health care provider about what kind of birth control (contraception) is best for you.  Get vaccinated against hepatitis B and human papillomavirus (HPV).  If you are at risk of being infected with HIV (human immunodeficiency virus),  talk with your health care provider about taking a prescription medicine to prevent HIV infection. You are at risk for HIV if you: ? Are a man who has sex with other men. ? Are sexually active with more than one partner. ? Take drugs by injection. ? Have a sex partner who has HIV. ? Have unprotected sex. ? Have sex with someone who has sex with both men and women. ? Have had an STI.   Follow these instructions at home:  Take over-the-counter and prescription medicines only as told by your health care provider.  Keep all follow-up visits. This is important. Where to find more information  Centers for Disease Control and Prevention: FootballExhibition.com.br  Planned Parenthood: www.plannedparenthood.org  Office on [employer] [facility]: http://hoffman.com/ Summary  Practicing safe sex means taking steps before and during sex to reduce your risk getting an STI, giving your partner an STI, and having an unwanted or unplanned pregnancy.  Before having sex with a new partner, talk to your partner about past partners, past STIs, and drug use.  Use a condom every time you have vaginal, oral, or anal sex. Both females and males should wear condoms during oral sex.  Check your body regularly for sores, blisters, rashes, or unusual discharge. If you notice any of these problems, visit your health care provider.  See your health care provider for regular screenings, exams, and tests for STIs. This information is not intended to replace advice given to  you by your health care provider. Make sure you discuss any questions you have with your health care provider. Document Revised: 01/30/2020 Document Reviewed: 01/30/2020 Elsevier Patient Education  2021 Elsevier Inc.  Testicular Self-Exam A self-examination of your testicles (testicular self-exam) involves looking at and feeling your testicles for abnormal lumps or swelling. Several things can cause swelling, lumps, or pain in your testicles. Some of these causes  are:  Injuries.  Inflammation.  Infection.  Buildup of fluids around the testicle (hydrocele).  Twisted testicles (testicular torsion).  Testicular cancer. You may be at risk for testicular cancer if you have: ? An undescended testicle (cryptorchidism). ? A history of previous testicular cancer. ? A family history of testicular cancer. General tips and recommendations  The testicles are easiest to examine after a warm bath or shower. They are more difficult to examine when you are cold because the muscles attached to the testicles retract and pull them up higher or into the abdomen.  A normal testicle is egg-shaped and feels firm. It is smooth and not tender.  It is normal to feel a firm, spaghetti-like cord at the back of your testicle. This is the spermatic cord. How to do a testicular self-exam 1. Stand and hold your penis away from your body. 2. Look at each testicle to check for changes in appearance, such as swelling or changes in size or shape. 3. Roll each testicle between your thumb and forefinger, feeling the entire testicle. Feel for: ? Lumps. ? Swelling. ? Discomfort. 4. Check the groin area between your abdomen and upper thighs on both sides of your body. Look and feel for any swelling or bumps that are tender. These could be enlarged lymph nodes.   Contact a health care provider if:  You find any bumps or lumps, such as a small, hard, pea-sized lump.  You find swelling, pain, or soreness.  You see or feel any other changes in your testicles. Summary  A self-examination of your testicles (testicular self-exam) involves looking at and feeling your testicles for any changes.  Check each of your testicles for lumps, swelling, or discomfort. These changes can be caused by many things.  Check for swelling or tender bumps in your groin area between your lower abdomen and upper thighs. This information is not intended to replace advice given to you by your health  care provider. Make sure you discuss any questions you have with your health care provider. Document Revised: 08/01/2019 Document Reviewed: 08/01/2019 Elsevier Patient Education  2021 [employer].

## 2020-10-12 LAB — HEPATITIS C ANTIBODY
Hepatitis C Ab: NONREACTIVE
SIGNAL TO CUT-OFF: 0.02 (ref <1.00)

## 2020-10-12 LAB — HIV ANTIBODY (ROUTINE TESTING W REFLEX): HIV 1&2 Ab, 4th Generation: NONREACTIVE

## 2020-10-14 ENCOUNTER — Encounter: Payer: Self-pay | Admitting: Internal Medicine

## 2020-10-14 NOTE — Assessment & Plan Note (Signed)
Here for CPX Anxiety depression: Currently on no medications, has weekly counseling.  Seems to be doing well. ADD: Reports is not an issue at this time RTC 1 year

## 2020-10-14 NOTE — Assessment & Plan Note (Signed)
Tdap: Today Had 2 meningitis shots, outgrowth  Bexsero, had HPVs COVID vaccine x3 Flu shot today CMP, FLP, CBC, TSH, hep C, HIV Patient education: Safe sex, self testicular exam.  Currently using very little alcohol, trying to stay away from marijuana, praised

## 2020-10-16 DIAGNOSIS — Z713 Dietary counseling and surveillance: Secondary | ICD-10-CM | POA: Diagnosis not present

## 2020-10-23 DIAGNOSIS — Z713 Dietary counseling and surveillance: Secondary | ICD-10-CM | POA: Diagnosis not present

## 2020-10-30 DIAGNOSIS — Z713 Dietary counseling and surveillance: Secondary | ICD-10-CM | POA: Diagnosis not present

## 2020-11-06 DIAGNOSIS — Z713 Dietary counseling and surveillance: Secondary | ICD-10-CM | POA: Diagnosis not present

## 2020-11-13 DIAGNOSIS — Z713 Dietary counseling and surveillance: Secondary | ICD-10-CM | POA: Diagnosis not present

## 2020-11-20 DIAGNOSIS — Z713 Dietary counseling and surveillance: Secondary | ICD-10-CM | POA: Diagnosis not present

## 2020-11-27 DIAGNOSIS — Z713 Dietary counseling and surveillance: Secondary | ICD-10-CM | POA: Diagnosis not present

## 2020-12-04 DIAGNOSIS — Z713 Dietary counseling and surveillance: Secondary | ICD-10-CM | POA: Diagnosis not present

## 2020-12-21 DIAGNOSIS — Z713 Dietary counseling and surveillance: Secondary | ICD-10-CM | POA: Diagnosis not present

## 2021-01-01 DIAGNOSIS — Z713 Dietary counseling and surveillance: Secondary | ICD-10-CM | POA: Diagnosis not present

## 2021-01-17 ENCOUNTER — Encounter: Payer: Self-pay | Admitting: Family Medicine

## 2021-01-17 ENCOUNTER — Other Ambulatory Visit: Payer: Self-pay

## 2021-01-17 ENCOUNTER — Telehealth (INDEPENDENT_AMBULATORY_CARE_PROVIDER_SITE_OTHER): Payer: BC Managed Care – PPO | Admitting: Family Medicine

## 2021-01-17 DIAGNOSIS — J40 Bronchitis, not specified as acute or chronic: Secondary | ICD-10-CM | POA: Insufficient documentation

## 2021-01-17 DIAGNOSIS — J440 Chronic obstructive pulmonary disease with acute lower respiratory infection: Secondary | ICD-10-CM | POA: Insufficient documentation

## 2021-01-17 DIAGNOSIS — J209 Acute bronchitis, unspecified: Secondary | ICD-10-CM | POA: Insufficient documentation

## 2021-01-17 MED ORDER — AZITHROMYCIN 250 MG PO TABS: ORAL_TABLET | ORAL | 0 refills | Status: DC

## 2021-01-17 NOTE — Progress Notes (Signed)
MyChart Video Visit    Virtual Visit via Video Note   This visit type was conducted due to national recommendations for restrictions regarding the COVID-19 Pandemic (e.g. social distancing) in an effort to limit this patient's exposure and mitigate transmission in our community. This patient is at least at moderate risk for complications without adequate follow up. This format is felt to be most appropriate for this patient at this time. Physical exam was limited by quality of the video and audio technology used for the visit. Luster Landsberg was able to get the patient set up on a video visit.  Patient location: home alone Patient and provider in visit Provider location: home   I discussed the limitations of evaluation and management by telemedicine and the availability of in person appointments. The patient expressed understanding and agreed to proceed.  Visit Date: 01/17/2021  Today's healthcare provider: Donato Schultz, DO     Subjective:    Patient ID: Devin Chang, male    DOB: Jun 15, 1991, 30 y.o.   MRN: [id number]  No chief complaint on file.   HPI Patient is in today for cough x 1 week ,  Nasal congestion and cough.  He has been travelling a lot   Sometimes productive  Pt has had 2 home tests that are neg for covid  .+  Fatigue.     Pt had been taking muciniex and tylenol    Past Medical History:  Diagnosis Date  . Anxiety   . Depression   . Hearing loss 2018   left ear, ENT recommend hearing aid  . Meniere disease, bilateral   . Panic attacks   . Right knee injury 2012   small chondral injury, right arthroscopy in 01/2012    Past Surgical History:  Procedure Laterality Date  . KNEE ARTHROSCOPY W/ DEBRIDEMENT Right 01/16/2012   w/ Plica release, at [facility] in [place], [place]  . TONSILLECTOMY  2018  . WISDOM TOOTH EXTRACTION  2012   Dr. Retta Mac [place]    Family History  Problem Relation Age of Onset  . Arthritis Sister   . Depression  Sister   . Colon cancer Maternal Uncle        colon cancer  . Hypertension Maternal Grandmother   . Hyperlipidemia Maternal Grandmother   . Dementia Paternal Grandmother   . Cancer Paternal Grandfather        esophageal cancer  . Suicidality Neg Hx   . Prostate cancer Neg Hx     Social History   Socioeconomic History  . Marital status: Single    Spouse name: Not on file  . Number of children: 0  . Years of education: 46  . Highest education level: Not on file  Occupational History  . Occupation: graduated  [employer]   . Occupation: golf profesional   Tobacco Use  . Smoking status: Former Games developer  . Smokeless tobacco: Former Neurosurgeon  . Tobacco comment: no tobacco at present   Substance and Sexual Activity  . Alcohol use: Yes    Alcohol/week: 0.0 standard drinks    Comment: sporadic etoh  . Drug use: Yes    Types: Marijuana    Comment: Marijuana; less than before trying to quit  . Sexual activity: Not on file  Other Topics Concern  . Not on file  Social History Narrative   Lives at his own place   Social Determinants of Health   Financial Resource Strain: Not on file  Food Insecurity:  Not on file  Transportation Needs: Not on file  Physical Activity: Not on file  Stress: Not on file  Social Connections: Not on file  Intimate Partner Violence: Not on file    Outpatient Medications Prior to Visit  Medication Sig Dispense Refill  . cromolyn (OPTICROM) 4 % ophthalmic solution Place 1 drop into both eyes 4 (four) times daily. 10 mL 3   No facility-administered medications prior to visit.    No Known Allergies  Review of Systems  Constitutional: Negative for chills, fever and malaise/fatigue.  HENT: Negative for congestion and sore throat.   Eyes: Negative for blurred vision.  Respiratory: Positive for cough and sputum production. Negative for shortness of breath.   Cardiovascular: Negative for chest pain, palpitations and leg swelling.  Gastrointestinal:  Negative for abdominal pain, blood in stool and nausea.  Genitourinary: Negative for dysuria and frequency.  Musculoskeletal: Negative for falls.  Skin: Negative for rash.  Neurological: Negative for dizziness, loss of consciousness and headaches.  Endo/Heme/Allergies: Negative for environmental allergies.  Psychiatric/Behavioral: Negative for depression. The patient is not nervous/anxious.        Objective:    Physical Exam Vitals and nursing note reviewed.  Constitutional:      Appearance: Normal appearance.  Pulmonary:     Effort: Pulmonary effort is normal.  Neurological:     Mental Status: He is alert.     SpO2 97%  Wt Readings from Last 3 Encounters:  10/11/20 198 lb 4 oz (89.9 kg)  07/16/20 183 lb 4 oz (83.1 kg)  10/17/16 162 lb (73.5 kg)    Diabetic Foot Exam - Simple   No data filed    Lab Results  Component Value Date   WBC 6.7 10/11/2020   HGB 14.5 10/11/2020   HCT 43.3 10/11/2020   PLT 293.0 10/11/2020   GLUCOSE 73 10/11/2020   CHOL 157 10/11/2020   TRIG 135.0 10/11/2020   HDL 41.50 10/11/2020   LDLCALC 89 10/11/2020   ALT 31 10/11/2020   AST 22 10/11/2020   NA 138 10/11/2020   K 5.0 10/11/2020   CL 103 10/11/2020   CREATININE 1.07 10/11/2020   BUN 22 10/11/2020   CO2 31 10/11/2020   TSH 1.36 10/11/2020    Lab Results  Component Value Date   TSH 1.36 10/11/2020   Lab Results  Component Value Date   WBC 6.7 10/11/2020   HGB 14.5 10/11/2020   HCT 43.3 10/11/2020   MCV 91.5 10/11/2020   PLT 293.0 10/11/2020   Lab Results  Component Value Date   NA 138 10/11/2020   K 5.0 10/11/2020   CO2 31 10/11/2020   GLUCOSE 73 10/11/2020   BUN 22 10/11/2020   CREATININE 1.07 10/11/2020   BILITOT 0.6 10/11/2020   ALKPHOS 65 10/11/2020   AST 22 10/11/2020   ALT 31 10/11/2020   PROT 7.1 10/11/2020   ALBUMIN 4.6 10/11/2020   CALCIUM 10.0 10/11/2020   ANIONGAP 9 10/17/2016   GFR 94.46 10/11/2020   Lab Results  Component Value Date   CHOL  157 10/11/2020   Lab Results  Component Value Date   HDL 41.50 10/11/2020   Lab Results  Component Value Date   LDLCALC 89 10/11/2020   Lab Results  Component Value Date   TRIG 135.0 10/11/2020   Lab Results  Component Value Date   CHOLHDL 4 10/11/2020   No results found for: HGBA1C     Assessment & Plan:   Problem List Items Addressed  This Visit      Unprioritized   RESOLVED: Acute bronchitis with COPD (HCC) - Primary   Relevant Medications   azithromycin (ZITHROMAX Z-PAK) 250 MG tablet   Bronchitis    Pt will get a pcr covid test z pak  con't mucinex  Call back prn / if covid test is positive or symptoms worsen      Relevant Medications   azithromycin (ZITHROMAX Z-PAK) 250 MG tablet     I am having Devin Chang start on azithromycin. I am also having him maintain his cromolyn.  Meds ordered this encounter  Medications  . azithromycin (ZITHROMAX Z-PAK) 250 MG tablet    Sig: As directed    Dispense:  6 each    Refill:  0    I discussed the assessment and treatment plan with the patient. The patient was provided an opportunity to ask questions and all were answered. The patient agreed with the plan and demonstrated an understanding of the instructions.   The patient was advised to call back or seek an in-person evaluation if the symptoms worsen or if the condition fails to improve as anticipated.    Donato Schultz, DO  [facility] [phone] (phone) [phone] (fax)  [facility]

## 2021-01-17 NOTE — Assessment & Plan Note (Signed)
Pt will get a pcr covid test z pak  con't mucinex  Call back prn / if covid test is positive or symptoms worsen

## 2021-01-22 DIAGNOSIS — Z713 Dietary counseling and surveillance: Secondary | ICD-10-CM | POA: Diagnosis not present

## 2021-02-06 DIAGNOSIS — Z713 Dietary counseling and surveillance: Secondary | ICD-10-CM | POA: Diagnosis not present

## 2021-02-20 DIAGNOSIS — Z713 Dietary counseling and surveillance: Secondary | ICD-10-CM | POA: Diagnosis not present

## 2021-03-06 DIAGNOSIS — Z713 Dietary counseling and surveillance: Secondary | ICD-10-CM | POA: Diagnosis not present

## 2021-03-20 DIAGNOSIS — Z713 Dietary counseling and surveillance: Secondary | ICD-10-CM | POA: Diagnosis not present

## 2021-04-02 DIAGNOSIS — Z713 Dietary counseling and surveillance: Secondary | ICD-10-CM | POA: Diagnosis not present

## 2021-04-10 DIAGNOSIS — Z713 Dietary counseling and surveillance: Secondary | ICD-10-CM | POA: Diagnosis not present

## 2021-04-29 DIAGNOSIS — Z713 Dietary counseling and surveillance: Secondary | ICD-10-CM | POA: Diagnosis not present

## 2021-05-20 DIAGNOSIS — Z713 Dietary counseling and surveillance: Secondary | ICD-10-CM | POA: Diagnosis not present

## 2021-06-03 DIAGNOSIS — Z713 Dietary counseling and surveillance: Secondary | ICD-10-CM | POA: Diagnosis not present

## 2021-07-31 DIAGNOSIS — Z20822 Contact with and (suspected) exposure to covid-19: Secondary | ICD-10-CM | POA: Diagnosis not present

## 2021-07-31 DIAGNOSIS — J111 Influenza due to unidentified influenza virus with other respiratory manifestations: Secondary | ICD-10-CM | POA: Diagnosis not present

## 2021-10-14 ENCOUNTER — Ambulatory Visit (INDEPENDENT_AMBULATORY_CARE_PROVIDER_SITE_OTHER): Payer: BC Managed Care – PPO | Admitting: Internal Medicine

## 2021-10-14 ENCOUNTER — Encounter: Payer: Self-pay | Admitting: Internal Medicine

## 2021-10-14 VITALS — BP 124/76 | HR 64 | Temp 97.7°F | Resp 16 | Ht 72.0 in | Wt 204.1 lb

## 2021-10-14 DIAGNOSIS — Z0000 Encounter for general adult medical examination without abnormal findings: Secondary | ICD-10-CM | POA: Diagnosis not present

## 2021-10-14 DIAGNOSIS — B349 Viral infection, unspecified: Secondary | ICD-10-CM | POA: Diagnosis not present

## 2021-10-14 LAB — COMPREHENSIVE METABOLIC PANEL
ALT: 28 U/L (ref 0–53)
AST: 19 U/L (ref 0–37)
Albumin: 4.9 g/dL (ref 3.5–5.2)
Alkaline Phosphatase: 64 U/L (ref 39–117)
BUN: 15 mg/dL (ref 6–23)
CO2: 30 mEq/L (ref 19–32)
Calcium: 10.1 mg/dL (ref 8.4–10.5)
Chloride: 102 mEq/L (ref 96–112)
Creatinine, Ser: 0.8 mg/dL (ref 0.40–1.50)
GFR: 119.61 mL/min (ref >60.00)
Glucose, Bld: 90 mg/dL (ref 70–99)
Potassium: 4.4 mEq/L (ref 3.5–5.1)
Sodium: 139 mEq/L (ref 135–145)
Total Bilirubin: 0.9 mg/dL (ref 0.2–1.2)
Total Protein: 7.6 g/dL (ref 6.0–8.3)

## 2021-10-14 LAB — CBC WITH DIFFERENTIAL/PLATELET
Basophils Absolute: 0 10*3/uL (ref 0.0–0.1)
Basophils Relative: 0.2 % (ref 0.0–3.0)
Eosinophils Absolute: 0.1 10*3/uL (ref 0.0–0.7)
Eosinophils Relative: 1.5 % (ref 0.0–5.0)
HCT: 44 % (ref 39.0–52.0)
Hemoglobin: 14.6 g/dL (ref 13.0–17.0)
Lymphocytes Relative: 17 % (ref 12.0–46.0)
Lymphs Abs: 1.6 10*3/uL (ref 0.7–4.0)
MCHC: 33.3 g/dL (ref 30.0–36.0)
MCV: 89.5 fl (ref 78.0–100.0)
Monocytes Absolute: 0.8 10*3/uL (ref 0.1–1.0)
Monocytes Relative: 8.1 % (ref 3.0–12.0)
Neutro Abs: 7 10*3/uL (ref 1.4–7.7)
Neutrophils Relative %: 73.2 % (ref 43.0–77.0)
Platelets: 288 10*3/uL (ref 150.0–400.0)
RBC: 4.92 Mil/uL (ref 4.22–5.81)
RDW: 13.4 % (ref 11.5–15.5)
WBC: 9.6 10*3/uL (ref 4.0–10.5)

## 2021-10-14 LAB — MONONUCLEOSIS SCREEN: Mono Screen: NEGATIVE

## 2021-10-14 NOTE — Assessment & Plan Note (Signed)
Tdap: 2022 Had 2 meningitis shots, outgrowth  Bexsero, had HPVs COVID boost, flu shot: Rec to proceed once he is better from current infection. Labs: Throat culture, mono, CMP, CBC, HIV Patient education: Safe sex, self testicular exam.   Diet: Is doing great, started to do calorie counting the last month, has lost 6 pounds. Exercise: Recommend to increase physical activity.

## 2021-10-14 NOTE — Assessment & Plan Note (Signed)
Here for CPX Viral syndrome: Upper respiratory symptoms as described above, has been exposed to mono, there is some evidence of otitis, patient denies any ear pain. Plan: Check mono, throat culture, further advised with results, consider antibiotics.  Otherwise Flonase and Astepro. See AVS RTC 1 year CPX

## 2021-10-14 NOTE — Progress Notes (Signed)
Subjective:    Patient ID: Devin Chang, male    DOB: 13-May-1991, 31 y.o.   MRN: [id number]  DOS:  10/14/2021 Type of visit - description: CPX  In general feels well. Did get sick about 10 days ago: Sinus congestion, some pain and postnasal dripping. Some cough without chest congestion. No fever chills No myalgias No nausea or vomiting. Girlfriend was diagnosed with mononucleosis 3 weeks ago.  They live together.   Review of Systems  Other than above, a 14 point review of systems is negative      Past Medical History:  Diagnosis Date   Anxiety    Depression    Hearing loss 2018   left ear, ENT recommend hearing aid   Meniere disease, bilateral    Right knee injury 2012   small chondral injury, right arthroscopy in 01/2012    Past Surgical History:  Procedure Laterality Date   KNEE ARTHROSCOPY W/ DEBRIDEMENT Right 01/16/2012   w/ Plica release, at [facility] in [place], [place]   TONSILLECTOMY  2018   WISDOM TOOTH EXTRACTION  2012   Dr. Retta Mac [place]   Social History   Socioeconomic History   Marital status: Single    Spouse name: Not on file   Number of children: 0   Years of education: 17   Highest education level: Not on file  Occupational History   Occupation: graduated  [employer].    Occupation: betwen jobs, was a Electronics engineer  Tobacco Use   Smoking status: Former   Smokeless tobacco: Former   Tobacco comments:    no tobacco at present   Substance and Sexual Activity   Alcohol use: Yes    Alcohol/week: 0.0 standard drinks    Comment: sporadic etoh   Drug use: Yes    Types: Marijuana    Comment: marijuana  infrquent   Sexual activity: Not on file  Other Topics Concern   Not on file  Social History Narrative   Lives at his own place w/ girlfriend    Social Determinants of Health   Financial Resource Strain: Not on file  Food Insecurity: Not on file  Transportation Needs: Not on file  Physical Activity: Not on file  Stress: Not on file   Social Connections: Not on file  Intimate Partner Violence: Not on file     Current Outpatient Medications  Medication Instructions   azithromycin (ZITHROMAX Z-PAK) 250 MG tablet As directed   cromolyn (OPTICROM) 4 % ophthalmic solution 1 drop, Both Eyes, 4 times daily       Objective:   Physical Exam BP 124/76 (BP Location: Right Arm, Patient Position: Sitting, Cuff Size: Small)    Pulse 64    Temp 97.7 F (36.5 C) (Oral)    Resp 16    Ht 6' (1.829 m)    Wt 204 lb 2 oz (92.6 kg)    SpO2 97%    BMI 27.68 kg/m  General: Well developed, NAD, BMI noted Neck: No  thyromegaly  HEENT:  Normocephalic . Face symmetric, atraumatic. TMs: Both are bulge.  R TM is red, L TM is pink. Throat: + Redness, no white patches, symmetric. Nose quite congested, sinuses non-TTP Lungs:  CTA B Normal respiratory effort, no intercostal retractions, no accessory muscle use. Heart: RRR,  no murmur.  Abdomen:  Not distended, soft, non-tender. No rebound or rigidity.   Lower extremities: no pretibial edema bilaterally  Skin: Exposed areas without rash. Not pale. Not jaundice Neurologic:  alert &  oriented X3.  Speech normal, gait appropriate for age and unassisted Strength symmetric and appropriate for age.  Psych: Cognition and judgment appear intact.  Cooperative with normal attention span and concentration.  Behavior appropriate. No anxious or depressed appearing.     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 Plan: Check mono, throat culture, further advised with results, consider antibiotics.  Otherwise Flonase and Astepro. See AVS RTC 1 year CPX   This visit occurred during the  SARS-CoV-2 public health emergency.  Safety protocols were in place, including screening questions prior to the visit, additional usage of staff PPE, and extensive cleaning of exam room while observing appropriate contact time as indicated for disinfecting solutions.

## 2021-10-14 NOTE — Patient Instructions (Signed)
°  Rest Fluids Tylenol or ibuprofen as needed Flonase 2 sprays on each side of the nose every day Astepro, is no other nasal spray over-the-counter: 2 sprays on each side of the nose twice daily until better Call if not gradually improving  Once you are better, please consider getting a flu shot and a COVID-vaccine.  GO TO THE LAB : Get the blood work     GO TO THE FRONT DESK, PLEASE SCHEDULE YOUR APPOINTMENTS Come back for a physical exam in 1 year

## 2021-10-15 ENCOUNTER — Encounter: Payer: Self-pay | Admitting: Internal Medicine

## 2021-10-16 LAB — CULTURE, GROUP A STREP
MICRO NUMBER:: 12967814
SPECIMEN QUALITY:: ADEQUATE

## 2021-10-16 LAB — HIV ANTIBODY (ROUTINE TESTING W REFLEX): HIV 1&2 Ab, 4th Generation: NONREACTIVE

## 2021-10-17 ENCOUNTER — Encounter: Payer: Self-pay | Admitting: Internal Medicine

## 2021-10-17 MED ORDER — AMOXICILLIN 500 MG PO CAPS: 1000.0000 mg | ORAL_CAPSULE | Freq: Two times a day (BID) | ORAL | 0 refills | Status: DC

## 2022-10-20 ENCOUNTER — Encounter: Payer: BC Managed Care – PPO | Admitting: Internal Medicine

## 2023-06-23 ENCOUNTER — Telehealth: Payer: Self-pay | Admitting: Internal Medicine

## 2023-06-23 NOTE — Telephone Encounter (Signed)
Please advise 

## 2023-06-23 NOTE — Telephone Encounter (Signed)
Yes, thx

## 2023-06-23 NOTE — Telephone Encounter (Signed)
Pt would like to re-est with Dr.Paz. He had moved and has now moved back. Please advise.

## 2023-06-24 NOTE — Telephone Encounter (Signed)
Okay to schedule re-establish appt. Thank you.

## 2023-06-24 NOTE — Telephone Encounter (Signed)
Lvm2 call back.

## 2023-07-01 ENCOUNTER — Encounter: Payer: Self-pay | Admitting: Family Medicine

## 2023-07-01 ENCOUNTER — Ambulatory Visit: Payer: BC Managed Care – PPO | Admitting: Family Medicine

## 2023-07-01 VITALS — BP 124/73 | HR 63 | Ht 72.5 in | Wt 178.0 lb

## 2023-07-01 DIAGNOSIS — Z0000 Encounter for general adult medical examination without abnormal findings: Secondary | ICD-10-CM | POA: Diagnosis not present

## 2023-07-01 DIAGNOSIS — R0981 Nasal congestion: Secondary | ICD-10-CM

## 2023-07-01 DIAGNOSIS — F988 Other specified behavioral and emotional disorders with onset usually occurring in childhood and adolescence: Secondary | ICD-10-CM | POA: Diagnosis not present

## 2023-07-01 DIAGNOSIS — R222 Localized swelling, mass and lump, trunk: Secondary | ICD-10-CM | POA: Diagnosis not present

## 2023-07-01 MED ORDER — AMPHETAMINE-DEXTROAMPHETAMINE 15 MG PO TABS: 15.0000 mg | ORAL_TABLET | Freq: Two times a day (BID) | ORAL | 0 refills | Status: DC

## 2023-07-01 NOTE — Progress Notes (Signed)
Acute Office Visit  Subjective:     Patient ID: Devin Chang, male    DOB: 1991-06-14, 32 y.o.   MRN: [id number]  CC: ADD follow-up, nasal congestion  HPI Patient is in today for follow-up.   Discussed the use of AI scribe software for clinical note transcription with the patient, who gave verbal consent to proceed.  History of Present Illness   The patient a patient of Dr. Drue Novel, who just spent a year in [facility] for work, presents today primarily for a prescription refill of Adderall, which they have been using sparingly since their last refill in late August. They report no issues with the medication. He had been doing well on 15 mg BID.  In addition to the prescription refill, the patient reports difficulty breathing through their nose for the past several months. They describe a sensation of tightness and stuffiness, with no relief from blowing their nose or using over-the-counter remedies such as Zyrtec. They deny any associated allergy symptoms such as runny nose, sneezing, or itchy, watery eyes. They do, however, report a known allergy to cats and seasonal pollen, which primarily affects their eyes.  The patient also mentions a long-standing cyst-like lesion on their buttock, which has been present for approximately five to six years. They report no pain, drainage, or significant changes in size.   They also mention recent life changes, including a move, a new house, and a new pet, which have contributed to feelings of stress and occasional low mood. They are currently attending therapy sessions every ten days to manage these feelings.           ROS All review of systems negative except what is listed in the HPI      Objective:    BP 124/73   Pulse 63   Ht 6' 0.5" (1.842 m)   Wt 178 lb (80.7 kg)   SpO2 100%   BMI 23.81 kg/m    Physical Exam Vitals reviewed.  Constitutional:      Appearance: Normal appearance.  Cardiovascular:     Rate and Rhythm:  Normal rate and regular rhythm.     Heart sounds: Normal heart sounds.  Pulmonary:     Effort: Pulmonary effort is normal.     Breath sounds: Normal breath sounds.  Musculoskeletal:       Back:  Skin:    General: Skin is warm and dry.  Neurological:     Mental Status: He is alert and oriented to person, place, and time.  Psychiatric:        Mood and Affect: Mood normal.        Behavior: Behavior normal.        Thought Content: Thought content normal.        Judgment: Judgment normal.     No results found for any visits on 07/01/23.      Assessment & Plan:   Problem List Items Addressed This Visit       Active Problems   ADD (attention deficit disorder) Patient has recently moved back to this area and is seeking a refill of Adderall 15mg  twice daily. Patient has been using remaining supply sparingly, which may be contributing to mood changes. Okay to refill per PCP. -Refill Adderall 15mg  twice daily. -Contract updated today. UDS at next visit.     Relevant Medications   amphetamine-dextroamphetamine (ADDERALL) 15 MG tablet   amphetamine-dextroamphetamine (ADDERALL) 15 MG tablet (Start on 07/31/2023)   amphetamine-dextroamphetamine (ADDERALL) 15 MG tablet (  Start on 08/30/2023)   Other Visit Diagnoses     Encounter for medical examination to establish care    -  Primary    Nasal congestion     Patient reports difficulty breathing through nose for several months. No relief with Zyrtec. No other allergy symptoms reported. -Start Flonase, two sprays in each nostril daily, aiming towards the outside corner of the eyebrow. -Continue Zyrtec as needed, especially during allergy season. -Recommend saline nasal irrigation. -Consider ENT referral if no improvement after two months.     Buttocks nodule   Consider Lipoma Patient reports a soft, mobile mass deep in right upper buttock present for several years. No pain or changes noted. -Monitor for changes in size or onset of  symptoms.            Meds ordered this encounter  Medications   amphetamine-dextroamphetamine (ADDERALL) 15 MG tablet    Sig: Take 1 tablet by mouth 2 (two) times daily.    Dispense:  60 tablet    Refill:  0    Order Specific Question:   Supervising Provider    Answer:   Danise Edge A [[id number]]   amphetamine-dextroamphetamine (ADDERALL) 15 MG tablet    Sig: Take 1 tablet by mouth 2 (two) times daily.    Dispense:  60 tablet    Refill:  0    Order Specific Question:   Supervising Provider    Answer:   Danise Edge A [[id number]]   amphetamine-dextroamphetamine (ADDERALL) 15 MG tablet    Sig: Take 1 tablet by mouth 2 (two) times daily.    Dispense:  60 tablet    Refill:  0    Order Specific Question:   Supervising Provider    Answer:   Danise Edge A [[id number]]    Return in about 3 months (around 10/01/2023) for ADD follow-up, due for UDS.  Clayborne Dana, NP

## 2023-07-15 ENCOUNTER — Ambulatory Visit: Payer: BC Managed Care – PPO | Admitting: Physician Assistant

## 2023-07-15 ENCOUNTER — Encounter: Payer: Self-pay | Admitting: Physician Assistant

## 2023-07-15 VITALS — BP 136/78 | HR 111 | Temp 98.6°F | Ht 72.5 in | Wt 176.1 lb

## 2023-07-15 DIAGNOSIS — R0789 Other chest pain: Secondary | ICD-10-CM | POA: Diagnosis not present

## 2023-07-15 DIAGNOSIS — F419 Anxiety disorder, unspecified: Secondary | ICD-10-CM | POA: Diagnosis not present

## 2023-07-15 MED ORDER — MUPIROCIN 2 % EX OINT: 1.0000 | TOPICAL_OINTMENT | Freq: Every day | CUTANEOUS | 0 refills | Status: DC

## 2023-07-15 MED ORDER — PROPRANOLOL HCL 10 MG PO TABS: 10.0000 mg | ORAL_TABLET | Freq: Two times a day (BID) | ORAL | 0 refills | Status: DC

## 2023-07-15 NOTE — Progress Notes (Signed)
Established patient visit   Patient: Devin Chang   DOB: 11/26/1990   32 y.o. Male  MRN: [id number] Visit Date: 07/15/2023  Today's healthcare provider: Alfredia Ferguson, PA-C   Cc. Deep breaths, belly button lesion  Subjective     Today, patient reports difficulty taking deep breaths, he feels a constriction across the lower part of his chest and his diaphragm for the last 2 weeks or so.  He reports that waking up in the morning he typically has no issues and then throughout the day he will find the sensation starts happening.  He is very preoccupied with this sensation and finds it difficult to get enough air.  He has struggled with anxiety in the past and is not sure if the symptoms are secondary to anxiety, Adderall use or from his history of intermittent tobacco smoking and marijuana use.  He denies any chest pain at rest, denies any wheezing. He reports think he had a history of asthma as a child but thought that was secondary to anxiety as well.  He recalls feeling like his blood/heart is pounding more with activity as well. He recalls feeling this way during the visit today.  In addition he noticed an aggravated area in his bellybutton that is oozing and red.  He reports this happened back in June and he had an ultrasound to confirm it was not a hernia.  It self resolved.   Medications: Outpatient Medications Prior to Visit  Medication Sig   amphetamine-dextroamphetamine (ADDERALL) 15 MG tablet Take 1 tablet by mouth 2 (two) times daily.   [START ON 07/31/2023] amphetamine-dextroamphetamine (ADDERALL) 15 MG tablet Take 1 tablet by mouth 2 (two) times daily.   [START ON 08/30/2023] amphetamine-dextroamphetamine (ADDERALL) 15 MG tablet Take 1 tablet by mouth 2 (two) times daily.   fluticasone (FLONASE) 50 MCG/ACT nasal spray Place 2 sprays into both nostrils daily.   No facility-administered medications prior to visit.    Review of Systems  Constitutional:  Negative  for fatigue and fever.  Respiratory:  Positive for shortness of breath. Negative for cough.   Cardiovascular:  Negative for chest pain, palpitations and leg swelling.  Neurological:  Negative for dizziness and headaches.  Psychiatric/Behavioral:  The patient is nervous/anxious.        Objective    BP 136/78   Pulse (!) 111   Temp 98.6 F (37 C) (Oral)   Ht 6' 0.5" (1.842 m)   Wt 176 lb 2 oz (79.9 kg)   SpO2 100%   BMI 23.56 kg/m    Physical Exam Constitutional:      General: He is awake.     Appearance: He is well-developed.  HENT:     Head: Normocephalic.  Eyes:     Conjunctiva/sclera: Conjunctivae normal.  Cardiovascular:     Rate and Rhythm: Normal rate and regular rhythm.     Heart sounds: Normal heart sounds.  Pulmonary:     Effort: Pulmonary effort is normal.     Breath sounds: Normal breath sounds.  Skin:    General: Skin is warm.     Comments: There is a < 1 cm lesion in the umbilicus, erythematous and slightly oozing of clear fluid.  Neurological:     Mental Status: He is alert and oriented to person, place, and time.  Psychiatric:        Attention and Perception: Attention normal.        Mood and Affect: Mood normal.  Speech: Speech normal.        Behavior: Behavior is cooperative.      No results found for any visits on 07/15/23.  Assessment & Plan    1. Anxiety 2. Chest tightness Oxygen is 100% today.  Lungs clear.  No recent upper respiratory illness.  Patient is a former light tobacco smoker and used to smoke marijuana. On review of patients symptoms, I do feel like this chest tightness is secondary to anxiety.  It might not be helped by his Adderall usage, but when reviewing how he feels on Adderall he definitely notices positive improvement in concentration and focus when he takes it.  Since he has been on Adderall for a while and this chest tightness is relatively new, postponing discussion of changing ADHD med.  Discussed various  techniques to help calm anxious sensation/distract from tightness.  Patient does have a therapist and he is following up this week  Reviewed several medications that we can use as needed for anxiety.  We may need to consider a daily anxiety medication in the future. For now trial of a low-dose of propranolol at lunchtime as this seems to be what his symptoms start.  Follow-up in 1 month or earlier if needed  - propranolol (INDERAL) 10 MG tablet; Take 1 tablet (10 mg total) by mouth 2 (two) times daily.  Dispense: 30 tablet; Refill: 0  3. Umbilical granuloma Small < 1 cm , does appear slightly infected/inflamed.  Redness does not extend past lesion.  Prescribed antibiotic ointment to apply daily.  Advised patient to clean with soap and water but otherwise leave this area alone  - mupirocin ointment (BACTROBAN) 2 %; Apply 1 Application topically daily.  Dispense: 22 g; Refill: 0     Return in about 1 month (around 08/14/2023), or if symptoms worsen or fail to improve, for anxiety.       Alfredia Ferguson, PA-C  [facility] [phone] (phone) [phone] (fax)  [facility]

## 2023-07-17 ENCOUNTER — Ambulatory Visit: Payer: BC Managed Care – PPO | Admitting: Internal Medicine

## 2023-07-22 ENCOUNTER — Encounter: Payer: Self-pay | Admitting: Physician Assistant

## 2023-07-22 ENCOUNTER — Other Ambulatory Visit: Payer: Self-pay | Admitting: Physician Assistant

## 2023-07-22 DIAGNOSIS — R0789 Other chest pain: Secondary | ICD-10-CM

## 2023-07-22 DIAGNOSIS — F419 Anxiety disorder, unspecified: Secondary | ICD-10-CM

## 2023-08-14 ENCOUNTER — Telehealth: Payer: BC Managed Care – PPO | Admitting: Physician Assistant

## 2023-08-14 DIAGNOSIS — J019 Acute sinusitis, unspecified: Secondary | ICD-10-CM | POA: Diagnosis not present

## 2023-08-14 DIAGNOSIS — B9789 Other viral agents as the cause of diseases classified elsewhere: Secondary | ICD-10-CM | POA: Diagnosis not present

## 2023-08-14 MED ORDER — IPRATROPIUM BROMIDE 0.03 % NA SOLN: 2.0000 | Freq: Two times a day (BID) | NASAL | 0 refills | Status: DC

## 2023-08-14 NOTE — Progress Notes (Signed)
E-Visit for Sinus Problems  We are sorry that you are not feeling well.  Here is how we plan to help!  Based on what you have shared with me it looks like you have sinusitis.  Sinusitis is inflammation and infection in the sinus cavities of the head.  Based on your presentation I believe you most likely have Acute Viral Sinusitis.This is an infection most likely caused by a virus. There is not specific treatment for viral sinusitis other than to help you with the symptoms until the infection runs its course.  You may use an oral decongestant such as Mucinex D or if you have glaucoma or high blood pressure use plain Mucinex. Saline nasal spray help and can safely be used as often as needed for congestion, I have prescribed: Ipratropium Bromide nasal spray 0.03% 2 sprays in eah nostril 2-3 times a day, this can be used with Flonase nasal spray.   Some authorities believe that zinc sprays or the use of Echinacea may shorten the course of your symptoms.  Sinus infections are not as easily transmitted as other respiratory infection, however we still recommend that you avoid close contact with loved ones, especially the very young and elderly.  Remember to wash your hands thoroughly throughout the day as this is the number one way to prevent the spread of infection!  Home Care: Only take medications as instructed by your medical team. Do not take these medications with alcohol. A steam or ultrasonic humidifier can help congestion.  You can place a towel over your head and breathe in the steam from hot water coming from a faucet. Avoid close contacts especially the very young and the elderly. Cover your mouth when you cough or sneeze. Always remember to wash your hands.  Get Help Right Away If: You develop worsening fever or sinus pain. You develop a severe head ache or visual changes. Your symptoms persist after you have completed your treatment plan.  Make sure you Understand these  instructions. Will watch your condition. Will get help right away if you are not doing well or get worse.   Thank you for choosing an e-visit.  Your e-visit answers were reviewed by a board certified advanced clinical practitioner to complete your personal care plan. Depending upon the condition, your plan could have included both over the counter or prescription medications.  Please review your pharmacy choice. Make sure the pharmacy is open so you can pick up prescription now. If there is a problem, you may contact your provider through [employer] and have the prescription routed to another pharmacy.  Your safety is important to [place]. If you have drug allergies check your prescription carefully.   For the next 24 hours you can use MyChart to ask questions about today's visit, request a non-urgent call back, or ask for a work or school excuse. You will get an email in the next two days asking about your experience. I hope that your e-visit has been valuable and will speed your recovery.    I have spent 5 minutes in review of e-visit questionnaire, review and updating patient chart, medical decision making and response to patient.   Margaretann Loveless, PA-C

## 2023-08-19 ENCOUNTER — Ambulatory Visit: Payer: BC Managed Care – PPO | Admitting: Internal Medicine

## 2023-08-19 ENCOUNTER — Encounter: Payer: Self-pay | Admitting: Internal Medicine

## 2023-08-19 VITALS — BP 124/86 | HR 85 | Temp 97.5°F | Resp 16 | Ht 72.5 in | Wt 175.0 lb

## 2023-08-19 DIAGNOSIS — Z79899 Other long term (current) drug therapy: Secondary | ICD-10-CM

## 2023-08-19 DIAGNOSIS — R0789 Other chest pain: Secondary | ICD-10-CM

## 2023-08-19 DIAGNOSIS — F419 Anxiety disorder, unspecified: Secondary | ICD-10-CM

## 2023-08-19 DIAGNOSIS — F988 Other specified behavioral and emotional disorders with onset usually occurring in childhood and adolescence: Secondary | ICD-10-CM | POA: Diagnosis not present

## 2023-08-19 DIAGNOSIS — Z09 Encounter for follow-up examination after completed treatment for conditions other than malignant neoplasm: Secondary | ICD-10-CM

## 2023-08-19 MED ORDER — PROPRANOLOL HCL 10 MG PO TABS: 10.0000 mg | ORAL_TABLET | Freq: Two times a day (BID) | ORAL | 0 refills | Status: DC

## 2023-08-19 NOTE — Assessment & Plan Note (Signed)
Anxiety, depression, ADD, insomnia:  -2013 evaluated by psychologist, Dx ADD, anxiety and depression, was recommended to treat anxiety first. -Seen by me in 2016 with similar symptoms, I recommended to treat with SSRI first, stop marijuana use and see psychiatry. -He was living in [place]   earlier this year, was treated for ADD successfully with Adderall, plan is to continue Adderall, UDS today. - For anxiety and depression, PHQ-9 11, GAD score: 7.  Current treatment is psychotherapy twice weekly and  propranolol which is helping with palpitations. - Also complained of insomnia, on melatonin, plans to try magnesium OTC which is okay.  Tips on healthy sleeping provided. Preventive care: Has a sinus infection, getting better, flu shot once he is completely well. RTC scheduled for 09/2023 CPX.

## 2023-08-19 NOTE — Patient Instructions (Signed)
When you finish prednisone and then the allergies, proceed with a flu shot at the pharmacy.  You can also do a COVID-vaccine.  Provide a urine sample before you leave  When you need a refill, please call your pharmacy  See you in few months for your physical

## 2023-08-19 NOTE — Progress Notes (Signed)
   Subjective:    Patient ID: Devin Chang, male    DOB: 05-21-1991, 32 y.o.   MRN: [id number]  DOS:  08/19/2023 Type of visit - description: f/u   07/01/2023.  Seen here, by one of my partners regards  ADD, prescribed Adderall  15  mg twice daily.   07/15/2023: Seen for anxiety. +palpitation, was rx propranolol, helped  Here for follow-up.  Review of Systems See above   Past Medical History:  Diagnosis Date   Anxiety    Depression    Hearing loss 2018   left ear, ENT recommend hearing aid   Meniere disease, bilateral    Right knee injury 2012   small chondral injury, right arthroscopy in 01/2012    Past Surgical History:  Procedure Laterality Date   KNEE ARTHROSCOPY W/ DEBRIDEMENT Right 01/16/2012   w/ Plica release, at [facility] in [place], [place]   TONSILLECTOMY  2018   WISDOM TOOTH EXTRACTION  2012   Dr. Retta Mac [place]    Current Outpatient Medications  Medication Instructions   amphetamine-dextroamphetamine (ADDERALL) 15 MG tablet 1 tablet, Oral, 2 times daily   amphetamine-dextroamphetamine (ADDERALL) 15 MG tablet 1 tablet, Oral, 2 times daily   [START ON 08/30/2023] amphetamine-dextroamphetamine (ADDERALL) 15 MG tablet 1 tablet, Oral, 2 times daily   benzonatate (TESSALON) 100 mg, Oral, 3 times daily PRN   clarithromycin (BIAXIN) 500 mg, Oral, 2 times daily   fluticasone (FLONASE) 50 MCG/ACT nasal spray 2 sprays, Each Nare, Daily   ipratropium (ATROVENT) 0.03 % nasal spray 2 sprays, Each Nare, Every 12 hours   methylPREDNISolone (MEDROL DOSEPAK) 4 MG TBPK tablet See admin instructions   mupirocin ointment (BACTROBAN) 2 % 1 Application, Topical, Daily   propranolol (INDERAL) 10 mg, Oral, 2 times daily       Objective:   Physical Exam BP 124/86   Pulse 85   Temp (!) 97.5 F (36.4 C) (Oral)   Resp 16   Ht 6' 0.5" (1.842 m)   Wt 175 lb (79.4 kg)   SpO2 99%   BMI 23.41 kg/m  General:   Well developed, NAD, BMI noted. HEENT:  Normocephalic . Face  symmetric, atraumatic Lower extremities: no pretibial edema bilaterally  Skin: Not pale. Not jaundice Neurologic:  alert & oriented X3.  Speech normal, gait appropriate for age and unassisted Psych--  Cognition and judgment appear intact.  Cooperative with normal attention span and concentration.  Behavior appropriate. No anxious or depressed appearing.      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 -Seen by me in 2016 with similar symptoms, I recommended to treat with SSRI first, stop marijuana use and see psychiatry. -He was living in [place]   earlier this year, was treated for ADD successfully with Adderall, plan is to continue Adderall, UDS today. - For anxiety and depression, PHQ-9 11, GAD score: 7.  Current treatment is psychotherapy twice weekly and  propranolol which is helping with palpitations. - Also complained of insomnia, on melatonin, plans to try magnesium OTC which is okay.  Tips on healthy sleeping provided. Preventive care: Has a sinus infection, getting better, flu shot once he is completely well. RTC scheduled for 09/2023 CPX.

## 2023-08-21 LAB — DRUG MONITORING PANEL 375977 , URINE
Alcohol Metabolites: NEGATIVE ng/mL (ref <500)
Amphetamine: 6920 ng/mL — ABNORMAL HIGH (ref <250)
Amphetamines: POSITIVE ng/mL — AB (ref <500)
Barbiturates: NEGATIVE ng/mL (ref <300)
Benzodiazepines: NEGATIVE ng/mL (ref <100)
Cocaine Metabolite: NEGATIVE ng/mL (ref <150)
Desmethyltramadol: NEGATIVE ng/mL (ref <100)
Marijuana Metabolite: 75 ng/mL — ABNORMAL HIGH (ref <5)
Marijuana Metabolite: POSITIVE ng/mL — AB (ref <20)
Methamphetamine: NEGATIVE ng/mL (ref <250)
Opiates: NEGATIVE ng/mL (ref <100)
Oxycodone: NEGATIVE ng/mL (ref <100)
Tramadol: NEGATIVE ng/mL (ref <100)

## 2023-08-21 LAB — DM TEMPLATE

## 2023-10-04 ENCOUNTER — Telehealth: Payer: Self-pay | Admitting: Family Medicine

## 2023-10-04 DIAGNOSIS — F988 Other specified behavioral and emotional disorders with onset usually occurring in childhood and adolescence: Secondary | ICD-10-CM

## 2023-10-05 ENCOUNTER — Telehealth: Payer: Self-pay | Admitting: Internal Medicine

## 2023-10-05 MED ORDER — AMPHETAMINE-DEXTROAMPHETAMINE 15 MG PO TABS: 15.0000 mg | ORAL_TABLET | Freq: Two times a day (BID) | ORAL | 0 refills | Status: DC

## 2023-10-05 NOTE — Telephone Encounter (Signed)
PDMP okay. Last UDS moderate risk. Rx sent

## 2023-10-05 NOTE — Telephone Encounter (Signed)
Patient has appt for Friday for a medication refill he needs to be seen sooner are needs to see about getting some medication for adderall sent over to the pharmacy dr Drue Novel does not have anything sooner then Friday  patient would like a call back regarding

## 2023-10-05 NOTE — Telephone Encounter (Signed)
Requesting: Adderall 15MG   Contract: 08/19/23 UDS: 08/19/23 Last Visit: 08/19/23 Next Visit: 10/09/23 Last Refill: 07/01/23 #60 and 0RF (x3)  Please Advise

## 2023-10-05 NOTE — Telephone Encounter (Signed)
Rx's already sent this afternoon.

## 2023-10-06 ENCOUNTER — Ambulatory Visit: Payer: BC Managed Care – PPO | Admitting: Internal Medicine

## 2023-10-09 ENCOUNTER — Ambulatory Visit: Payer: BC Managed Care – PPO | Admitting: Internal Medicine

## 2023-10-19 ENCOUNTER — Telehealth: Payer: Self-pay | Admitting: Internal Medicine

## 2023-10-19 NOTE — Telephone Encounter (Signed)
 LVM for pt to r/s appt on 11-02-2023 due to provider not in office at this time, appt can be rescheduled for same day at 1:40 if ok with pt.

## 2023-11-02 ENCOUNTER — Ambulatory Visit: Payer: Managed Care, Other (non HMO) | Admitting: Internal Medicine

## 2023-11-03 ENCOUNTER — Encounter: Payer: Self-pay | Admitting: Internal Medicine

## 2023-11-03 ENCOUNTER — Ambulatory Visit: Payer: Managed Care, Other (non HMO) | Admitting: Internal Medicine

## 2023-11-03 VITALS — BP 126/80 | HR 91 | Temp 98.4°F | Resp 16 | Ht 72.5 in | Wt 183.6 lb

## 2023-11-03 DIAGNOSIS — Z79899 Other long term (current) drug therapy: Secondary | ICD-10-CM | POA: Diagnosis not present

## 2023-11-03 DIAGNOSIS — F988 Other specified behavioral and emotional disorders with onset usually occurring in childhood and adolescence: Secondary | ICD-10-CM | POA: Diagnosis not present

## 2023-11-03 MED ORDER — AMPHETAMINE-DEXTROAMPHET ER 20 MG PO CP24
20.0000 mg | ORAL_CAPSULE | Freq: Two times a day (BID) | ORAL | 0 refills | Status: DC | PRN
Start: 2023-11-03 — End: 2023-11-30

## 2023-11-03 NOTE — Progress Notes (Unsigned)
 Subjective:    Patient ID: Devin Chang, male    DOB: June 24, 1991, 32 y.o.   MRN: [id number]  DOS:  11/03/2023 Type of visit - description: f/u  Follow-up from previous visit. Patient has been stressed lately, work related.  After many years, he stopped marijuana about  mid November entheses see last UDS).  Although he feels he is ADD is controlled  at the same time reports he has been feeling " space out" for years and he thought that would improve after he stopped marijuana, but it did not . I asked for examples of feeling "spaced out" he said that he has difficult time finding words  sometimes, when he is reading he not cannot concentrate.  Review of Systems See above   Past Medical History:  Diagnosis Date   Anxiety    Depression    Hearing loss 2018   left ear, ENT recommend hearing aid   Meniere disease, bilateral    Right knee injury 2012   small chondral injury, right arthroscopy in 01/2012    Past Surgical History:  Procedure Laterality Date   KNEE ARTHROSCOPY W/ DEBRIDEMENT Right 01/16/2012   w/ Plica release, at [facility] in [place], [place]   TONSILLECTOMY  2018   WISDOM TOOTH EXTRACTION  2012   Dr. Retta Mac [place]    Current Outpatient Medications  Medication Instructions   amphetamine-dextroamphetamine (ADDERALL) 15 MG tablet 1 tablet, Oral, 2 times daily   amphetamine-dextroamphetamine (ADDERALL) 15 MG tablet 1 tablet, Oral, 2 times daily   propranolol (INDERAL) 10 mg, Oral, 2 times daily       Objective:   Physical Exam BP 126/80 (BP Location: Left Arm, Cuff Size: Normal)   Pulse 91   Temp 98.4 F (36.9 C) (Oral)   Resp 16   Ht 6' 0.5" (1.842 m)   Wt 183 lb 9.6 oz (83.3 kg)   SpO2 97%   BMI 24.56 kg/m  General:   Well developed, NAD, BMI noted. HEENT:  Normocephalic . Face symmetric, atraumatic   Lower extremities: no pretibial edema bilaterally  Skin: Not pale. Not jaundice Neurologic:  alert & oriented X3.  Speech normal, gait  appropriate for age and unassisted Psych--  Cognition and judgment appear intact.  Cooperative with normal attention span and concentration.  Behavior appropriate. No anxious or depressed appearing.      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 At the last visit, UDS show + marijuana, he stopped consuming around mid November. Although I have under the impression that the ADD is well-controlled, today he reports many year history of feeling "spaced out" (see HPI).  Even after his stop marijuana. This could be a symptoms of poorly controlled ADD. Plan: UDS, continue beta-blockers for anxiety, increase Adderall XR to 20 mg twice daily, PDMP okay, Rx sent.  Continue marijuana abstinence. Referred to [facility] for further eval. Needs better stress management. RTC already scheduled for   CPX     Anxiety, depression, ADD, insomnia:  -2013 evaluated by psychologist, Dx ADD, anxiety and depression, was recommended to treat anxiety first. -Seen by me in 2016 with similar symptoms, I recommended to treat with SSRI first, stop marijuana use and see psychiatry. -He was living in [place]   earlier this year, was treated for ADD successfully with Adderall,  plan is to continue Adderall, UDS today. - For anxiety and depression, PHQ-9 11, GAD score: 7.  Current treatment is psychotherapy twice weekly and  propranolol which is helping with palpitations. - Also complained of insomnia, on melatonin, plans to try magnesium OTC which is okay.  Tips on healthy sleeping provided. Preventive care: Has a sinus infection, getting better, flu shot once he is completely well. RTC scheduled for 09/2023 CPX.

## 2023-11-03 NOTE — Patient Instructions (Signed)
 Provide a urine sample  [facility] in [place] [address]?[place], [place] [address] Phone: [phone] Fax: [phone] [email or web address]

## 2023-11-04 NOTE — Assessment & Plan Note (Signed)
 Anxiety, depression, ADD, insomnia. At the last visit, UDS show + marijuana, he stopped using it  around mid November. Although I have been under the impression that the ADD is well-controlled, today he reports many year history of feeling "spaced out" (see HPI), even after his stop marijuana. This could be a symptoms of poorly controlled ADD. Plan: UDS, continue beta-blockers for anxiety, increase Adderall XR to 20 mg twice daily, PDMP okay, Rx sent.  Continue marijuana abstinence. Referred to [facility] for further eval. Needs better stress management. RTC already scheduled for   CPX

## 2023-11-07 LAB — AMPHETAMINE CONF, UR
Amphetamine GC/MS Conf: >3000 ng/mL
Amphetamine: POSITIVE — AB
Amphetamines: POSITIVE — AB
Methamphetamine: NEGATIVE

## 2023-11-07 LAB — URINE DRUGS OF ABUSE SCREEN W ALC, ROUTINE (REF LAB)
Barbiturate Quant, Ur: NEGATIVE ng/mL
Benzodiazepine Quant, Ur: NEGATIVE ng/mL
Cannabinoid Quant, Ur: NEGATIVE ng/mL
Cocaine (Metab.): NEGATIVE ng/mL
Ethanol, Urine: NEGATIVE %
Methadone Screen, Urine: NEGATIVE ng/mL
Opiate Quant, Ur: NEGATIVE ng/mL
PCP Quant, Ur: NEGATIVE ng/mL
Propoxyphene: NEGATIVE ng/mL

## 2023-11-08 ENCOUNTER — Encounter: Payer: Self-pay | Admitting: Internal Medicine

## 2023-11-13 ENCOUNTER — Encounter: Payer: Self-pay | Admitting: Internal Medicine

## 2023-11-13 ENCOUNTER — Ambulatory Visit (INDEPENDENT_AMBULATORY_CARE_PROVIDER_SITE_OTHER): Payer: BC Managed Care – PPO | Admitting: Internal Medicine

## 2023-11-13 VITALS — BP 128/76 | HR 72 | Temp 98.1°F | Resp 16 | Ht 72.5 in | Wt 181.0 lb

## 2023-11-13 DIAGNOSIS — Z0000 Encounter for general adult medical examination without abnormal findings: Secondary | ICD-10-CM | POA: Diagnosis not present

## 2023-11-13 DIAGNOSIS — F988 Other specified behavioral and emotional disorders with onset usually occurring in childhood and adolescence: Secondary | ICD-10-CM

## 2023-11-13 MED ORDER — KETOCONAZOLE 2 % EX SHAM: 1.0000 | MEDICATED_SHAMPOO | CUTANEOUS | 3 refills | Status: AC

## 2023-11-13 NOTE — Progress Notes (Signed)
 Subjective:    Patient ID: Devin Chang, male    DOB: [birth year], 33 y.o.   MRN: [id number]  DOS:  11/13/2023 Type of visit - description: CPX  In general feeling well. No new concerns.   Review of Systems   A 14 point review of systems is negative    Past Medical History:  Diagnosis Date   Anxiety    Depression    Hearing loss 2018   left ear, ENT recommend hearing aid   Meniere disease, bilateral    Right knee injury 2012   small chondral injury, right arthroscopy in 01/2012    Past Surgical History:  Procedure Laterality Date   KNEE ARTHROSCOPY W/ DEBRIDEMENT Right 01/16/2012   w/ Plica release, at [facility] in [place], [place]   TONSILLECTOMY  2018   WISDOM TOOTH EXTRACTION  2012   Dr. Retta Mac [place]   Social History   Socioeconomic History   Marital status: Single    Spouse name: Not on file   Number of children: 0   Years of education: 17   Highest education level: Bachelor's degree (e.g., BA, AB, BS)  Occupational History   Occupation: graduated  [employer] communications   Occupation: Electronics engineer  Tobacco Use   Smoking status: Former    Current packs/day: 0.00    Average packs/day: 0.3 packs/day for 4.0 years (1.0 ttl pk-yrs)    Types: Cigarettes, Cigars, E-cigarettes    Quit date: 05/08/2016    Years since quitting: 7.5   Smokeless tobacco: Former   Tobacco comments:    no tobacco at present   Substance and Sexual Activity   Alcohol use: Yes    Alcohol/week: 4.0 standard drinks of alcohol    Types: 4 Glasses of wine per week    Comment: sporadic etoh   Drug use: Not Currently   Sexual activity: Not Currently    Birth control/protection: Condom  Other Topics Concern   Not on file  Social History Narrative   Lives at his own place w/ girlfriend    Social Drivers of Corporate investment banker Strain: Low Risk  (08/18/2023)   Overall Financial Resource Strain (CARDIA)    Difficulty of Paying Living Expenses: Not hard at all  Food  Insecurity: No Food Insecurity (08/18/2023)   Hunger Vital Sign    Worried About Running Out of Food in the Last Year: Never true    Ran Out of Food in the Last Year: Never true  Transportation Needs: No Transportation Needs (08/18/2023)   PRAPARE - Administrator, Civil Service (Medical): No    Lack of Transportation (Non-Medical): No  Physical Activity: Sufficiently Active (08/18/2023)   Exercise Vital Sign    Days of Exercise per Week: 5 days    Minutes of Exercise per Session: 60 min  Stress: Stress Concern Present (08/18/2023)   [employer] of Occupational Health - Occupational Stress Questionnaire    Feeling of Stress : Rather much  Social Connections: Moderately Isolated (08/18/2023)   Social Connection and Isolation Panel [NHANES]    Frequency of Communication with Friends and Family: More than three times a week    Frequency of Social Gatherings with Friends and Family: Three times a week    Attends Religious Services: Never    Active Member of Clubs or Organizations: Yes    Attends Banker Meetings: More than 4 times per year    Marital Status: Never married  Intimate Partner Violence: Not  on file    Current Outpatient Medications  Medication Instructions   amphetamine-dextroamphetamine (ADDERALL XR) 20 MG 24 hr capsule 20 mg, Oral, 2 times daily PRN   [START ON 11/16/2023] ketoconazole (NIZORAL) 2 % shampoo 1 Application, Topical, 2 times weekly   propranolol (INDERAL) 10 mg, Oral, 2 times daily       Objective:   Physical Exam BP 128/76   Pulse 72   Temp 98.1 F (36.7 C) (Oral)   Resp 16   Ht 6' 0.5" (1.842 m)   Wt 181 lb (82.1 kg)   SpO2 98%   BMI 24.21 kg/m  General: Well developed, NAD, BMI noted Neck: No  thyromegaly  HEENT:  Normocephalic . Face symmetric, atraumatic Lungs:  CTA B Normal respiratory effort, no intercostal retractions, no accessory muscle use. Heart: RRR,  no murmur.  Abdomen:  Not distended, soft,  non-tender. No rebound or rigidity.   Lower extremities: no pretibial edema bilaterally  Skin: Some baldness on a male pattern Neurologic:  alert & oriented X3.  Speech normal, gait appropriate for age and unassisted Strength symmetric and appropriate for age.  Psych: Cognition and judgment appear intact.  Cooperative with normal attention span and concentration.  Behavior appropriate. No anxious or depressed appearing.     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 fall, COVID booster Labs: CMP CBC FLP. Patient education: Safe sex, testicular self-exam. Lifestyle: Encouraged to exercise regularly, provided tips about healthy eating.  .   Other issues: Anxiety, depression, ADD, insomnia: See LOV, already reached  out  by the attention specialist  HOH: Left-sided, typically does not use a hearing aid, not getting better or worse.  Has tinnitus only occasionally. Male pattern baldness: Wonders about treatment, recommend to start with topical minoxidil twice daily.  He said he also has some dandruff, on exam today scalp looks healthy.  Recommend ketoconazole twice weekly. Stop minoxidil if it causes any irritation. RTC 1 year, sooner if needed

## 2023-11-13 NOTE — Patient Instructions (Signed)
 Apply Rogaine OTC (minoxidil is the generic) twice daily. For dandruff, I will send a shampoo called ketoconazole, use it twice weekly.     GO TO THE LAB : Get the blood work     Please go to the front desk: Arrange for a physical in 1 year, come back sooner if needed

## 2023-11-14 ENCOUNTER — Encounter: Payer: Self-pay | Admitting: Internal Medicine

## 2023-11-14 LAB — COMPREHENSIVE METABOLIC PANEL
AG Ratio: 1.9 (calc) (ref 1.0–2.5)
ALT: 18 U/L (ref 9–46)
AST: 18 U/L (ref 10–40)
Albumin: 4.8 g/dL (ref 3.6–5.1)
Alkaline phosphatase (APISO): 58 U/L (ref 36–130)
BUN: 19 mg/dL (ref 7–25)
CO2: 24 mmol/L (ref 20–32)
Calcium: 9.8 mg/dL (ref 8.6–10.3)
Chloride: 100 mmol/L (ref 98–110)
Creat: 0.95 mg/dL (ref 0.60–1.26)
Globulin: 2.5 g/dL (ref 1.9–3.7)
Glucose, Bld: 82 mg/dL (ref 65–99)
Potassium: 4.1 mmol/L (ref 3.5–5.3)
Sodium: 139 mmol/L (ref 135–146)
Total Bilirubin: 1 mg/dL (ref 0.2–1.2)
Total Protein: 7.3 g/dL (ref 6.1–8.1)

## 2023-11-14 LAB — LIPID PANEL
Cholesterol: 139 mg/dL (ref <200)
HDL: 52 mg/dL (ref >=40)
LDL Cholesterol (Calc): 70 mg/dL
Non-HDL Cholesterol (Calc): 87 mg/dL (ref <130)
Total CHOL/HDL Ratio: 2.7 (calc) (ref <5.0)
Triglycerides: 83 mg/dL (ref <150)

## 2023-11-14 LAB — CBC WITH DIFFERENTIAL/PLATELET
Absolute Lymphocytes: 1869 {cells}/uL (ref 850–3900)
Absolute Monocytes: 591 {cells}/uL (ref 200–950)
Basophils Absolute: 29 {cells}/uL (ref 0–200)
Basophils Relative: 0.4 %
Eosinophils Absolute: 183 {cells}/uL (ref 15–500)
Eosinophils Relative: 2.5 %
HCT: 43.7 % (ref 38.5–50.0)
Hemoglobin: 14.6 g/dL (ref 13.2–17.1)
MCH: 30.8 pg (ref 27.0–33.0)
MCHC: 33.4 g/dL (ref 32.0–36.0)
MCV: 92.2 fL (ref 80.0–100.0)
MPV: 12 fL (ref 7.5–12.5)
Monocytes Relative: 8.1 %
Neutro Abs: 4628 {cells}/uL (ref 1500–7800)
Neutrophils Relative %: 63.4 %
Platelets: 325 10*3/uL (ref 140–400)
RBC: 4.74 10*6/uL (ref 4.20–5.80)
RDW: 12.2 % (ref 11.0–15.0)
Total Lymphocyte: 25.6 %
WBC: 7.3 10*3/uL (ref 3.8–10.8)

## 2023-11-14 NOTE — Assessment & Plan Note (Signed)
 Here for CPX Tdap: 2022 Had 2 meningitis shots, outgrowth  Bexsero, had HPVs Vaccines I recommend: Flu shot q. fall, COVID booster Labs: CMP CBC FLP. Patient education: Safe sex, testicular self-exam. Lifestyle: Encouraged to exercise regularly, provided tips about healthy eating.  Marland Kitchen

## 2023-11-14 NOTE — Assessment & Plan Note (Signed)
 Here for CPX .   Other issues: Anxiety, depression, ADD, insomnia: See LOV, already reached  out  by the attention specialist  HOH: Left-sided, typically does not use a hearing aid, not getting better or worse.  Has tinnitus only occasionally. Male pattern baldness: Wonders about treatment, recommend to start with topical minoxidil twice daily.  He said he also has some dandruff, on exam today scalp looks healthy.  Recommend ketoconazole twice weekly. Stop minoxidil if it causes any irritation. RTC 1 year, sooner if needed

## 2023-11-15 ENCOUNTER — Encounter: Payer: Self-pay | Admitting: Internal Medicine

## 2023-11-30 ENCOUNTER — Telehealth: Payer: Self-pay | Admitting: Neurology

## 2023-11-30 ENCOUNTER — Other Ambulatory Visit: Payer: Self-pay | Admitting: Internal Medicine

## 2023-11-30 DIAGNOSIS — R0789 Other chest pain: Secondary | ICD-10-CM

## 2023-11-30 DIAGNOSIS — F419 Anxiety disorder, unspecified: Secondary | ICD-10-CM

## 2023-11-30 MED ORDER — PROPRANOLOL HCL 10 MG PO TABS: 10.0000 mg | ORAL_TABLET | Freq: Two times a day (BID) | ORAL | 0 refills | Status: DC

## 2023-11-30 MED ORDER — AMPHETAMINE-DEXTROAMPHETAMINE 20 MG PO TABS: 20.0000 mg | ORAL_TABLET | Freq: Two times a day (BID) | ORAL | 0 refills | Status: DC

## 2023-11-30 MED ORDER — AMPHETAMINE-DEXTROAMPHET ER 20 MG PO CP24: 20.0000 mg | ORAL_CAPSULE | Freq: Two times a day (BID) | ORAL | 0 refills | Status: DC | PRN

## 2023-11-30 NOTE — Telephone Encounter (Signed)
 Spoke [facility]- informed to cancel Adderall XR 20mg  tablets.

## 2023-11-30 NOTE — Addendum Note (Signed)
 Addended by: Willow Ora E on: 11/30/2023 03:20 PM   Modules accepted: Orders

## 2023-11-30 NOTE — Telephone Encounter (Signed)
 Tried calling Pt back, left vm asking for call back, okay for [id number] to get further information. PCP refilled Adderall earlier today, however they had discussed at last visit that he would be seeing an attention specialist.

## 2023-11-30 NOTE — Telephone Encounter (Signed)
 Patient Comment: I gave this one a chance, i had 15mg  instant release twice a day before. Ive noticed that im struggling to get to sleep on this XR twice a day. Can i get back on IR but in 20mg ?

## 2023-11-30 NOTE — Telephone Encounter (Signed)
 Send him a message, prescription sent, needs to see the  attention specialist as recommended.

## 2023-11-30 NOTE — Telephone Encounter (Signed)
 Pt states he is not sleeping well on Adderall XR 20mg , he is requesting to go back to regular Adderall. Please advise?

## 2023-11-30 NOTE — Telephone Encounter (Signed)
 Please call the pharmacy, delete Adderall XR prescription Adderall 20 mg sent.

## 2023-11-30 NOTE — Telephone Encounter (Signed)
 CRM # [id number] Owner: None Status: Unresolved Open  Priority: Routine Created on: 11/30/2023 03:02 PM By: York Ram   Primary Information  Source  Devin Chang (Patient)   Subject  Devin Chang (Patient)   Topic  Clinical - Prescription Issue    Communication  Reason for CRM: Patient wanting to get amphetamine-dextroamphetamine (ADDERALL XR) 20 MG 24 hr capsule. Patient is not sleeping well on the XR and wants to get it changed to a regular pill

## 2023-11-30 NOTE — Telephone Encounter (Signed)
 Copied from CRM [id number]. Topic: General - Call Back - No Documentation >> Nov 30, 2023  2:11 PM Florestine Avers wrote: Reason for CRM: Patient states that he was messaging back and fourth with Dr. Drue Novel CMA and he would rather a phone call. Patient is requesting a call back and can be reached at [phone] or the number on file [phone].

## 2023-12-30 ENCOUNTER — Telehealth: Payer: Self-pay | Admitting: Internal Medicine

## 2023-12-31 MED ORDER — AMPHETAMINE-DEXTROAMPHETAMINE 20 MG PO TABS: 20.0000 mg | ORAL_TABLET | Freq: Two times a day (BID) | ORAL | 0 refills | Status: DC

## 2023-12-31 NOTE — Telephone Encounter (Signed)
 Requesting: Adderall 20mg   Contract: 08/19/23 UDS: 08/19/23 Last Visit: 11/13/23 Next Visit: None Last Refill: 11/30/23 #60 and 0RF  Please Advise

## 2023-12-31 NOTE — Telephone Encounter (Signed)
 PDMP okay, Rx sent

## 2024-03-09 ENCOUNTER — Ambulatory Visit: Admitting: Internal Medicine

## 2024-03-21 ENCOUNTER — Encounter: Payer: Self-pay | Admitting: Internal Medicine

## 2024-03-21 ENCOUNTER — Ambulatory Visit (INDEPENDENT_AMBULATORY_CARE_PROVIDER_SITE_OTHER): Admitting: Internal Medicine

## 2024-03-21 VITALS — BP 126/82 | HR 65 | Temp 98.2°F | Resp 16 | Ht 72.5 in | Wt 194.0 lb

## 2024-03-21 DIAGNOSIS — F988 Other specified behavioral and emotional disorders with onset usually occurring in childhood and adolescence: Secondary | ICD-10-CM

## 2024-03-21 DIAGNOSIS — F419 Anxiety disorder, unspecified: Secondary | ICD-10-CM | POA: Diagnosis not present

## 2024-03-21 MED ORDER — AMPHETAMINE-DEXTROAMPHETAMINE 20 MG PO TABS: 20.0000 mg | ORAL_TABLET | Freq: Two times a day (BID) | ORAL | 0 refills | Status: DC

## 2024-03-21 NOTE — Progress Notes (Signed)
   Subjective:    Patient ID: Devin Chang, male    DOB: 09-28-1990, 33 y.o.   MRN: [id number]  DOS:  03/21/2024 Type of visit - description: Follow-up  Since the last visit, saw a ADD specialists. He was tested and according to the patient did poorly because he was not taking his ADD medication. They like him to come back for more testing but the cost is prohibitive.   Review of Systems See above   Past Medical History:  Diagnosis Date   Anxiety    Depression    Hearing loss 2018   left ear, ENT recommend hearing aid   Meniere disease, bilateral    Right knee injury 2012   small chondral injury, right arthroscopy in 01/2012    Past Surgical History:  Procedure Laterality Date   KNEE ARTHROSCOPY W/ DEBRIDEMENT Right 01/16/2012   w/ Plica release, at [facility] in [place], [place]   TONSILLECTOMY  2018   WISDOM TOOTH EXTRACTION  2012   Dr. Mohorn [place]    Current Outpatient Medications  Medication Instructions   amphetamine -dextroamphetamine  (ADDERALL) 20 MG tablet 20 mg, Oral, 2 times daily   ketoconazole  (NIZORAL ) 2 % shampoo 1 Application, Topical, 2 times weekly   propranolol  (INDERAL ) 10 mg, Oral, 2 times daily       Objective:   Physical Exam BP 126/82   Pulse 65   Temp 98.2 F (36.8 C) (Oral)   Resp 16   Ht 6' 0.5 (1.842 m)   Wt 194 lb (88 kg)   SpO2 98%   BMI 25.95 kg/m  General:   Well developed, NAD, BMI noted. HEENT:  Normocephalic . Face symmetric, atraumatic   Neurologic:  alert & oriented X3.  Speech normal, gait appropriate for age and unassisted Psych--  Cognition and judgment appear intact.  Cooperative with normal attention span and concentration.  Behavior appropriate. No anxious or depressed appearing.      Assessment     ASSESSMENT  Anxiety depression ADD HOH L ear, dx age 79 (sudden deafness syndrome, saw Dr Colby, now otosclerosis ); typically doesn't use a hearing aid   Plan: Anxiety, depression, ADD,  insomnia: Since LOV, saw the [facility], it was extremely expensive for him. They did not prescribe Adderall for him but the workup was incomplete. He ran out of Adderall and has noted a big difference, decreased energy, poor concentration. 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 Plan: Get notes from the specialists. PDMP okay, Adderall refilled. Strongly recommend to stop marijuana (last used 10 days ago). The reason he was referred to them is that ADD was not completely well-controlled even with Adderall.  See note from 11/03/2023.Consider psychiatry referral if symptoms not as well-controlled as he would like to. RTC 3 months.

## 2024-03-21 NOTE — Patient Instructions (Signed)
    Next office visit for a checkup in 3 months Please make an appointment before you leave today

## 2024-03-21 NOTE — Assessment & Plan Note (Signed)
 Anxiety, depression, ADD, insomnia: Since LOV, saw the [facility], it was extremely expensive for him. They did not prescribe Adderall for him but the workup was incomplete. He ran out of Adderall and has noted a big difference, decreased energy, poor concentration. 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 Plan: Get notes from the specialists. PDMP okay, Adderall refilled. Strongly recommend to stop marijuana (last used 10 days ago). The reason he was referred to them is that ADD was not completely well-controlled even with Adderall.  See note from 11/03/2023.Consider psychiatry referral if symptoms not as well-controlled as he would like to. RTC 3 months.

## 2024-03-22 ENCOUNTER — Telehealth: Payer: Self-pay

## 2024-03-22 NOTE — Telephone Encounter (Signed)
 Records received from [facility]. Placed in yellow folder.

## 2024-03-28 NOTE — Telephone Encounter (Signed)
 We received almost 100 pages. DOS 01/12/2024. He met criteria for the following diagnosis: -ADHD predominantly inattentive. -Comorbid sleep deprivation and hearing loss.  Was counseled extensively. Benefits of stimulant medication appear to outweigh the current risk.  He was rec to cont adderall and ADD  guanfacine ER 1 mg tablet at night  Was recommended to follow-up in 6 weeks but cost is prohibited.  Plan is the same. Refilled Adderall as needed, referred to psychiatry if symptoms are not completely well-controlled. Will send records to be scanned .

## 2024-04-28 ENCOUNTER — Telehealth: Payer: Self-pay | Admitting: Internal Medicine

## 2024-04-28 NOTE — Telephone Encounter (Signed)
 Requesting: Adderall 20mg  Contract: Yes UDS: 11/03/23 Last Visit: 03/21/2024 Next Visit: 06/27/2024 Last Refill: 03/21/24  Please Advise

## 2024-04-28 NOTE — Telephone Encounter (Unsigned)
 Copied from CRM [id number]. Topic: Clinical - Prescription Issue >> Apr 28, 2024  3:05 PM Gennette ORN wrote: Reason for CRM: Patient calling due to medication refill of amphetamine -dextroamphetamine  (ADDERALL) 20 MG tablet. Patient states pharmacy states he has no refills.

## 2024-04-29 MED ORDER — AMPHETAMINE-DEXTROAMPHETAMINE 20 MG PO TABS: 20.0000 mg | ORAL_TABLET | Freq: Two times a day (BID) | ORAL | 0 refills | Status: DC

## 2024-04-29 NOTE — Telephone Encounter (Signed)
 PDMP okay, Rx sent

## 2024-06-27 ENCOUNTER — Ambulatory Visit: Admitting: Internal Medicine

## 2024-07-06 ENCOUNTER — Ambulatory Visit (INDEPENDENT_AMBULATORY_CARE_PROVIDER_SITE_OTHER): Admitting: Internal Medicine

## 2024-07-06 ENCOUNTER — Encounter: Payer: Self-pay | Admitting: Internal Medicine

## 2024-07-06 VITALS — BP 136/80 | HR 70 | Temp 98.4°F | Resp 16 | Ht 72.5 in | Wt 187.1 lb

## 2024-07-06 DIAGNOSIS — F32A Depression, unspecified: Secondary | ICD-10-CM

## 2024-07-06 DIAGNOSIS — R399 Unspecified symptoms and signs involving the genitourinary system: Secondary | ICD-10-CM

## 2024-07-06 DIAGNOSIS — F419 Anxiety disorder, unspecified: Secondary | ICD-10-CM | POA: Diagnosis not present

## 2024-07-06 DIAGNOSIS — F988 Other specified behavioral and emotional disorders with onset usually occurring in childhood and adolescence: Secondary | ICD-10-CM | POA: Diagnosis not present

## 2024-07-06 LAB — URINALYSIS, ROUTINE W REFLEX MICROSCOPIC
Bilirubin Urine: NEGATIVE
Hgb urine dipstick: NEGATIVE
Ketones, ur: NEGATIVE
Leukocytes,Ua: NEGATIVE
Nitrite: NEGATIVE
Specific Gravity, Urine: 1.025 (ref 1.000–1.030)
Total Protein, Urine: NEGATIVE
Urine Glucose: NEGATIVE
Urobilinogen, UA: 0.2 (ref 0.0–1.0)
pH: 5.5 (ref 5.0–8.0)

## 2024-07-06 MED ORDER — PROPRANOLOL HCL 20 MG PO TABS: 20.0000 mg | ORAL_TABLET | Freq: Two times a day (BID) | ORAL | 1 refills | Status: AC

## 2024-07-06 MED ORDER — AMPHETAMINE-DEXTROAMPHETAMINE 20 MG PO TABS: 20.0000 mg | ORAL_TABLET | Freq: Two times a day (BID) | ORAL | 0 refills | Status: DC

## 2024-07-06 NOTE — Patient Instructions (Addendum)
 GO TO THE LAB :  Provide a urine sample  Then, go to the front desk for the checkout Please make an appointment for a physical exam by March 2026. Come back sooner if needed  We are increasing propranolol  to 20 mg twice daily, printing the prescription     YOUR PLAN (AI generated)  ANXIETY DISORDER WITH ELEVATED HEART RATE: Your anxiety has worsened, and your heart rate has increased after you ran out of propranolol . This is also being affected by a legal issue with your neighbor. -We have refilled your propranolol  prescription and increased the dosage to 20 mg twice daily. -Please resume your counseling sessions to help manage your anxiety.  ATTENTION-DEFICIT HYPERACTIVITY DISORDER (ADHD): Your ADHD symptoms are well-managed with your current Adderall prescription. - We printed out a prescription for you.  POST-VOID DRIBBLING (URINARY LEAKAGE): You have been experiencing intermittent urinary leakage and dribbling without pain or blood in your urine.   -We will perform a urinalysis to rule out any infection. -Please limit your caffeine intake to one cup in the morning.

## 2024-07-06 NOTE — Progress Notes (Unsigned)
 Subjective:    Patient ID: Devin Chang, male    DOB: 19-Jan-1991, 33 y.o.   MRN: [id number]  DOS:  07/06/2024 Follow-up  Discussed the use of AI scribe software for clinical note transcription with the patient, who gave verbal consent to proceed.  History of Present Illness Devin Chang is a 33 year old male with anxiety who presents with increased anxiety and heart rate after running out of propranolol .  Anxiety and tachycardia - Increased anxiety and heart rate after running out of propranolol  on Friday - Heart rate measured at 107 bpm - Anxiety exacerbated by ongoing legal issue with neighbor - Difficulty achieving a satisfying deep breath - No chest pain or significant respiratory distress   Urinary incontinence and lower urinary tract symptoms - Intermittent urinary incontinence with leakage occurring minutes after urination - Leakage sufficient to show on pants but not a large volume - Occasional dribbling of urine - No dysuria or hematuria - Family history of prostate cancer (uncle diagnosed)  Attention deficit disorder and medication tolerability - Currently taking Adderall with effective symptom control for ADD      Review of Systems See above   Past Medical History:  Diagnosis Date   Anxiety    Depression    Hearing loss 2018   left ear, ENT recommend hearing aid   Meniere disease, bilateral    Right knee injury 2012   small chondral injury, right arthroscopy in 01/2012    Past Surgical History:  Procedure Laterality Date   KNEE ARTHROSCOPY W/ DEBRIDEMENT Right 01/16/2012   w/ Plica release, at [facility] in [place], [place]   TONSILLECTOMY  2018   WISDOM TOOTH EXTRACTION  2012   Dr. Mohorn [place]    Current Outpatient Medications  Medication Instructions   amphetamine -dextroamphetamine  (ADDERALL) 20 MG tablet 20 mg, Oral, 2 times daily   ketoconazole  (NIZORAL ) 2 % shampoo 1 Application, Topical, 2 times weekly   propranolol  (INDERAL ) 10  mg, Oral, 2 times daily       Objective:   Physical Exam BP 136/80   Pulse (!) 107   Temp 98.4 F (36.9 C) (Oral)   Resp 16   Ht 6' 0.5 (1.842 m)   Wt 187 lb 2 oz (84.9 kg)   SpO2 98%   BMI 25.03 kg/m  General:   Well developed, NAD, BMI noted.  HEENT:  Normocephalic . Face symmetric, atraumatic Lungs:  CTA B Normal respiratory effort, no intercostal retractions, no accessory muscle use. Heart: RRR,  no murmur.  Abdomen:  Not distended, soft, non-tender. No rebound or rigidity.   Skin: Not pale. Not jaundice Lower extremities: no pretibial edema bilaterally  Neurologic:  alert & oriented X3.  Speech normal, gait appropriate for age and unassisted Psych--  Cognition and judgment appear intact.  Cooperative with normal attention span and concentration.  Behavior appropriate. No anxious or depressed appearing.     Assessment     ASSESSMENT  Anxiety depression (see phone note from 03/22/2024) ADD HOH L ear, dx age 4 (sudden deafness syndrome, saw Dr Colby, now otosclerosis ); typically doesn't use a hearing aid   ==== Assessment and Plan Assessment & Plan Anxiety, depression: Anxiety exacerbated by legal issue.  Heart rate elevated today, likely because anxiety running out of propranolol  few days ago.  After few minutes I rechecked his heart rate and it was in the 70s, he felt more relaxed. Because increased anxiety I offered SSRIs, previously Lexapro  because ED so he requested  to increase propranolol  and I think that is appropriate.  Prescription printed and for propranolol  at a increased dose of 20 mg twice daily. Call if that is not working.   Attention-deficit hyperactivity disorder, predominantly inattentive type Notes from [facility] were reviewed, see phone note from 03/22/2024. He meets criteria for ADHD-inattentive, was extensively counseled, benefits of stimulant medications with the risk. Was recommend continuing Adderall and added  guanfacine ER 1 mg tablet at night. ADHD symptoms well-managed with current Adderall alone.  PDMP okay, prescription printed.    Post-void dribbling (urinary leakage) Intermittent dribbling without dysuria or hematuria. Symptoms not indicative of prostate issues.  Patient wonders if this is due to caffeine use.  Plan: UA, urine culture, limit caffeine intake.  If symptoms worsen, he will call for sooner appointment otherwise we will reassess on RTC RTC 11-2024 CPX.

## 2024-07-07 LAB — URINE CULTURE
MICRO NUMBER:: 17163746
Result:: NO GROWTH
SPECIMEN QUALITY:: ADEQUATE

## 2024-07-07 NOTE — Assessment & Plan Note (Signed)
 Anxiety, depression: Anxiety exacerbated by legal issue.  Heart rate elevated today, likely because anxiety and  running out of propranolol  few days ago.  After few minutes I rechecked his heart rate and it was in the 70s, he felt more relaxed. Because increased anxiety I offered SSRIs, previously Lexapro  because ED so he requested to increase propranolol  and I think that is appropriate.  Prescription printed and for propranolol  at a increased dose of 20 mg twice daily. Call if that is not working. ADHD predominantly inattentive type Notes from [facility] were reviewed, see phone note from 03/22/2024. He meets criteria for ADHD-inattentive, was extensively counseled, benefits of stimulant medications with the risk. Was recommend continuing Adderall and added guanfacine ER 1 mg tablet at night (which is not taking). ADHD symptoms well-managed with current Adderall alone.  PDMP okay, prescription printed. Post-void dribbling (urinary leakage) Intermittent dribbling without dysuria or hematuria. Symptoms not indicative of a serious prostate issues.  Patient wonders if this is due to caffeine use.  Plan: UA, urine culture, limit caffeine intake .  If symptoms worsen, he will call for sooner appointment otherwise we will reassess on RTC RTC 11-2024 CPX.

## 2024-07-08 ENCOUNTER — Ambulatory Visit: Payer: Self-pay | Admitting: Internal Medicine

## 2024-07-08 LAB — DRUG MONITORING PANEL 375977 , URINE
Alcohol Metabolites: POSITIVE ng/mL — AB (ref <500)
Amphetamine: 6852 ng/mL — ABNORMAL HIGH (ref <250)
Amphetamines: POSITIVE ng/mL — AB (ref <500)
Barbiturates: NEGATIVE ng/mL (ref <300)
Benzodiazepines: NEGATIVE ng/mL (ref <100)
Cocaine Metabolite: NEGATIVE ng/mL (ref <150)
Desmethyltramadol: NEGATIVE ng/mL (ref <100)
Ethyl Glucuronide (ETG): 8526 ng/mL — ABNORMAL HIGH (ref <500)
Ethyl Sulfate (ETS): 1720 ng/mL — ABNORMAL HIGH (ref <100)
Marijuana Metabolite: NEGATIVE ng/mL (ref <20)
Methamphetamine: NEGATIVE ng/mL (ref <250)
Opiates: NEGATIVE ng/mL (ref <100)
Oxycodone: NEGATIVE ng/mL (ref <100)
Tramadol: NEGATIVE ng/mL (ref <100)

## 2024-07-08 LAB — DM TEMPLATE

## 2024-08-11 ENCOUNTER — Other Ambulatory Visit: Payer: Self-pay

## 2024-08-11 MED ORDER — AMPHETAMINE-DEXTROAMPHETAMINE 20 MG PO TABS: 20.0000 mg | ORAL_TABLET | Freq: Two times a day (BID) | ORAL | 0 refills | Status: DC

## 2024-08-11 NOTE — Telephone Encounter (Signed)
 Requesting: Adderall 20MG  Contract: 08/28/23 UDS: 07/06/24 Last Visit: 07/06/24 Next Visit: 12/02/24 Last Refill: 07/06/24 #60 and 0RF   Please Advise

## 2024-09-21 ENCOUNTER — Telehealth: Payer: Self-pay | Admitting: Internal Medicine

## 2024-09-21 MED ORDER — AMPHETAMINE-DEXTROAMPHETAMINE 20 MG PO TABS: 20.0000 mg | ORAL_TABLET | Freq: Two times a day (BID) | ORAL | 0 refills | Status: AC

## 2024-09-21 NOTE — Telephone Encounter (Signed)
 PDMP okay, Rx sent

## 2024-09-21 NOTE — Telephone Encounter (Signed)
 Requesting: Adderall 20mg   Contract:08/28/23 UDS: 07/06/24 Last Visit: 07/06/24 Next Visit: 12/02/24 Last Refill: 08/11/24 #60 and 0RF   Please Advise

## 2024-09-21 NOTE — Telephone Encounter (Signed)
 Copied from CRM #[id number]. Topic: Clinical - Medication Refill >> Sep 21, 2024 10:32 AM Willma R wrote: Medication: amphetamine -dextroamphetamine  (ADDERALL) 20 MG tablet   Has the patient contacted their pharmacy? Yes, call dr  This is the patient's preferred pharmacy:  [id number] - [place], [place] - [address] AT [facility] [address] [place] [address] Phone: [phone] Fax: [phone]  Is this the correct pharmacy for this prescription? yes  Has the prescription been filled recently? Yes  Is the patient out of the medication? Yes  Has the patient been seen for an appointment in the last year OR does the patient have an upcoming appointment? Yes  Can we respond through MyChart? Yes  Agent: Please be advised that Rx refills may take up to 3 business days. We ask that you follow-up with your pharmacy.

## 2024-12-02 ENCOUNTER — Encounter: Admitting: Internal Medicine
